# Patient Record
Sex: Male | Born: 1960 | Race: White | Hispanic: No | Marital: Married | Smoking: Never smoker
Health system: Southern US, Community
[De-identification: ages and names within clinical notes are randomized; demographics above are authoritative.]

## PROBLEM LIST (undated history)

## (undated) HISTORY — PX: TOTAL HIP ARTHROPLASTY: SHX124

## (undated) HISTORY — PX: VASECTOMY: SHX75

## (undated) HISTORY — PX: KNEE SURGERY: SHX244

---

## 2005-03-11 ENCOUNTER — Ambulatory Visit: Payer: Self-pay | Admitting: Unknown Physician Specialty

## 2005-03-11 LAB — HM COLONOSCOPY

## 2006-06-29 ENCOUNTER — Ambulatory Visit: Payer: Self-pay | Admitting: Unknown Physician Specialty

## 2006-08-24 ENCOUNTER — Ambulatory Visit: Payer: Self-pay | Admitting: Internal Medicine

## 2006-09-02 ENCOUNTER — Ambulatory Visit: Payer: Self-pay | Admitting: Unknown Physician Specialty

## 2006-09-09 ENCOUNTER — Ambulatory Visit: Payer: Self-pay | Admitting: Unknown Physician Specialty

## 2007-02-06 ENCOUNTER — Other Ambulatory Visit: Payer: Self-pay

## 2007-02-06 ENCOUNTER — Observation Stay: Payer: Self-pay | Admitting: Internal Medicine

## 2007-11-30 IMAGING — CR DG CHEST 2V
1 series · 2 of 2 positions shown · non-contrast
Comparison: none

REASON FOR EXAM: arm pain chest pain
COMMENTS:

PROCEDURE:     DXR - DXR CHEST PA (OR AP) AND LATERAL  - February 06, 2007  [DATE]
RESULT:     Comparison: No available comparison exam.

[Series 1: view not recorded · 0.17mm/px · 2 of 2 slices shown]
[im 1/2]
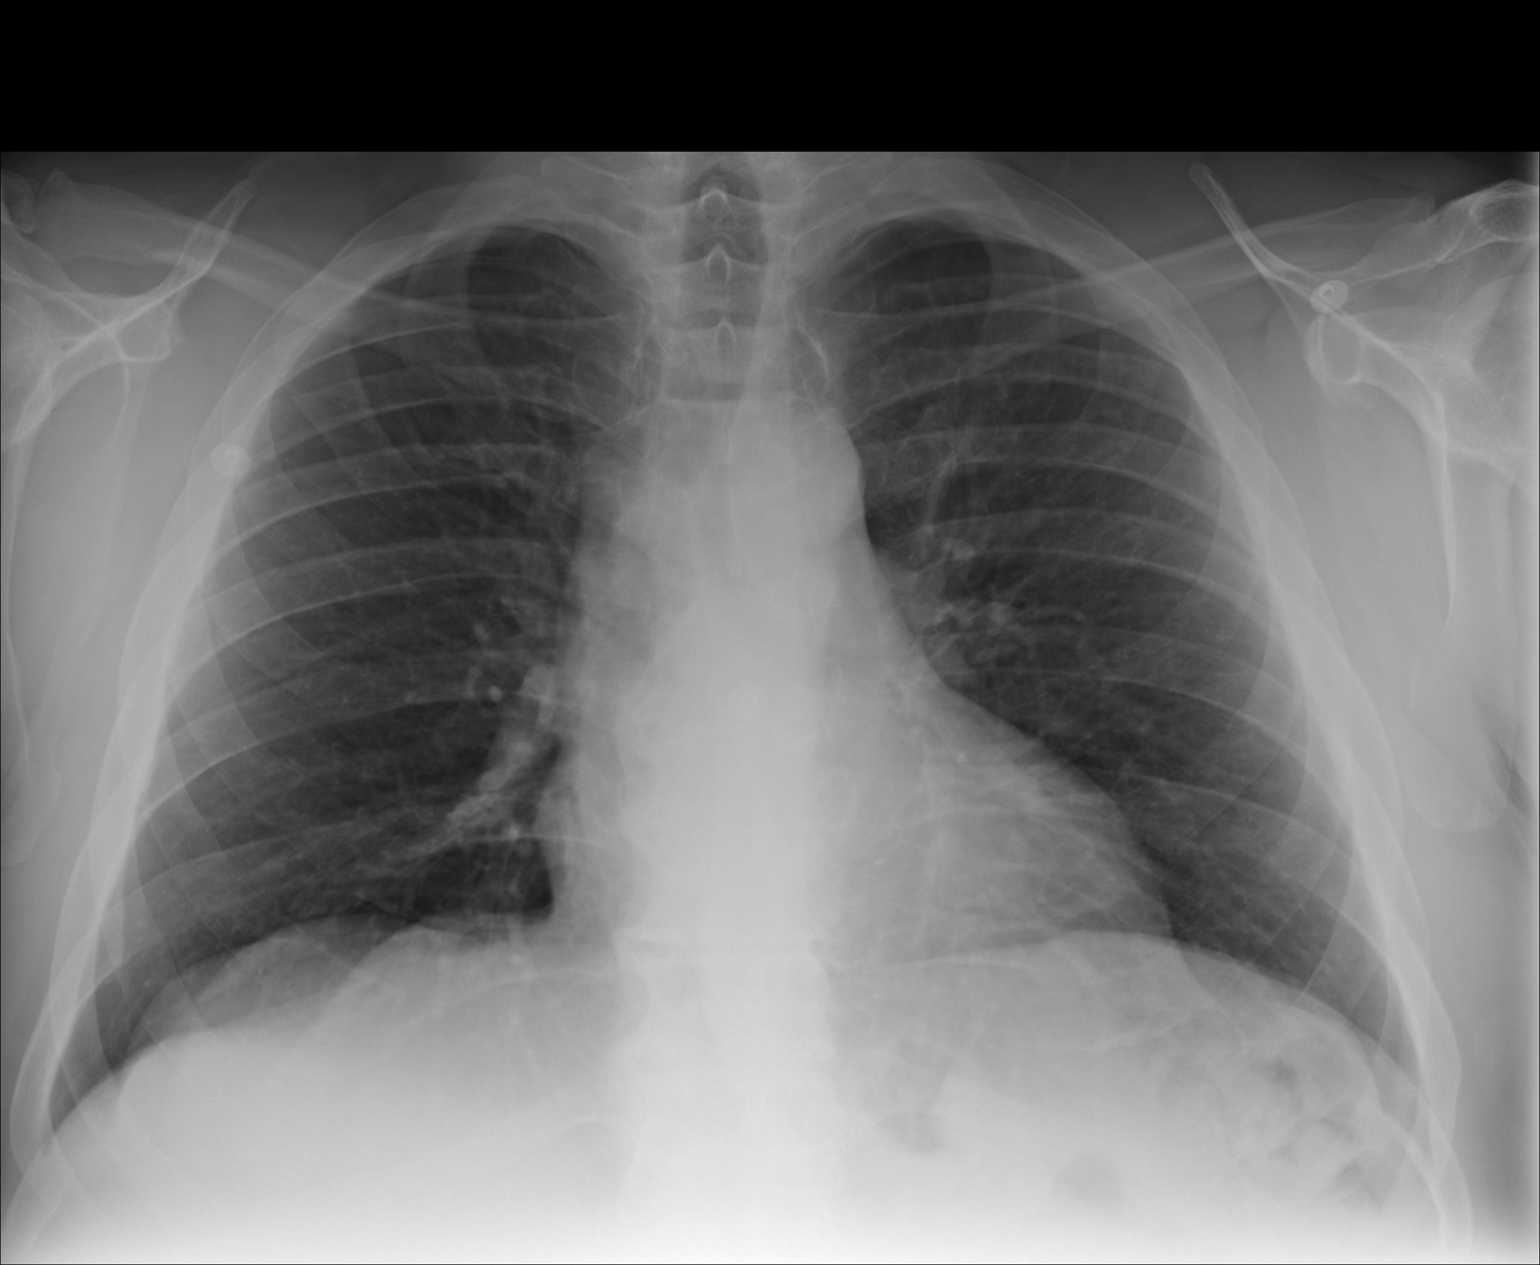
[im 2/2]
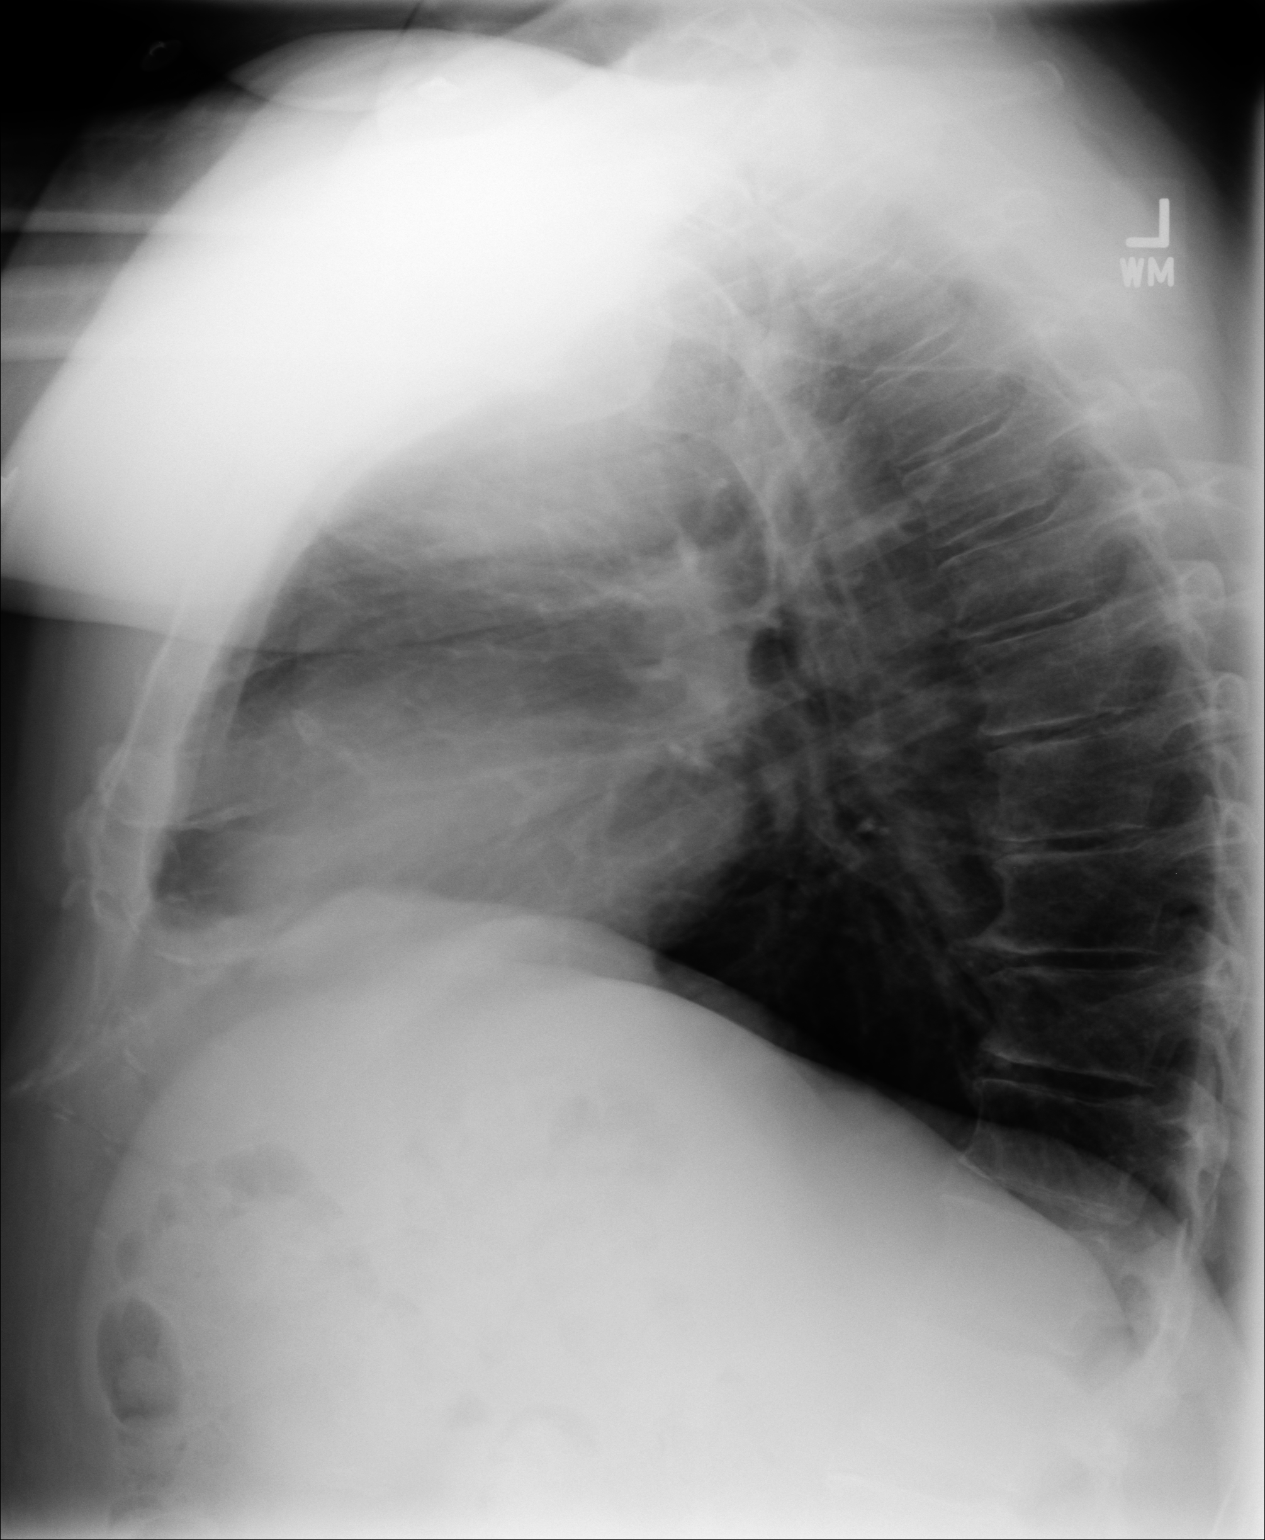

[2 of 2 positions shown; findings below may reference images not displayed]

FINDINGS: Low lung volumes. There is no significant pulmonary consolidation, pulmonary
edema, pleural effusion, nor pneumothorax. The cardiomediastinal silhouette
is within normal limits.

No displaced rib fracture is noted.
IMPRESSION: 1. No acute cardiopulmonary abnormality is noted.

## 2007-12-01 IMAGING — NM NM CHEST EXAM
1 series · 1 of 1 positions shown · non-contrast
Comparison: none

REASON FOR EXAM: CHEST PAIN
COMMENTS:

PROCEDURE:     NM  - NM GATED MYOVIEW 2 - DAY [DATE]  [DATE]
RESULT:
PRIMARY CARE PHYSICIAN:  Dr. Ngo.
HISTORY: This is a 46-year-old with abnormal EKG having arm and back pain
consistent with anginal equivalent.  The patient underwent a Bruce protocol
with an exercise time of 10 minutes achieving 11.7 METS, 80% MPHR with a
peak heart rate of 141 beats per minutes.  Peak systolic blood pressure is
169/93.  Baseline heart rate is 79 beats per minute. Baseline blood pressure
was 169/93.  The patient stopped due to end of protocol. There was no chest
pain or shortness of breath. Baseline EKG shows normal sinus rhythm, cannot
rule out inferior infarct and poor R-wave progression. Peak stress shows no
electrocardiographic changes or arrhythmia. The patient received 30.16 mCi
of Myoview peak stress.
IMAGE ANALYSIS: Normal LV systolic function with ejection fraction of 59%.
All myocardial walls have normal myocardial function. Rest and peak stress
myocardial perfusion images were compared showing normal myocardial
perfusion at rest and peak stress without evidence of myocardial ischemia.

[Series 1000: save_screens · 1 of 1 slices shown]
[im 1/1]
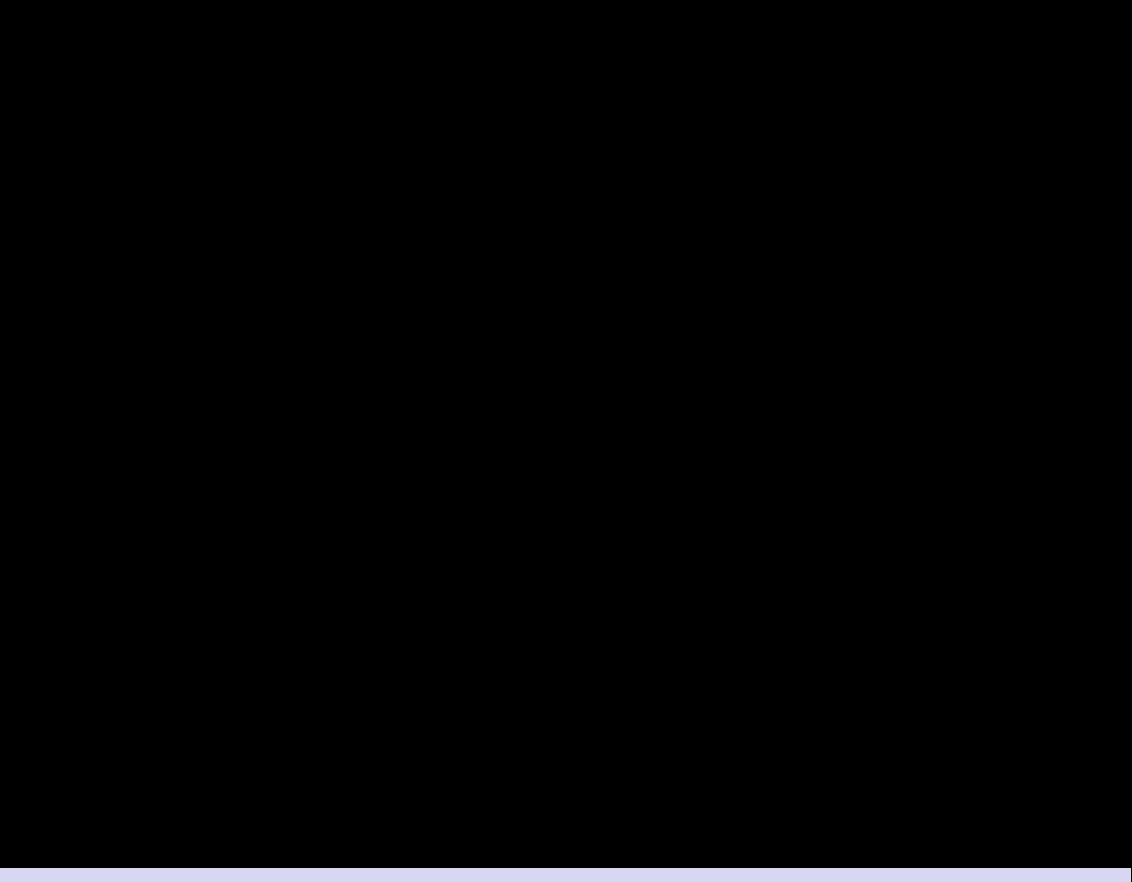

[1 of 1 positions shown; findings below may reference images not displayed]

IMPRESSION: 1)Normal treadmill EKG without evidence of ischemia.

2)Average exercise tolerance for age.

3)Normal LV systolic function with ejection fraction of 59%.

4)Normal stress images without evidence of myocardial ischemia.

## 2011-06-19 DIAGNOSIS — N529 Male erectile dysfunction, unspecified: Secondary | ICD-10-CM | POA: Insufficient documentation

## 2012-09-07 ENCOUNTER — Other Ambulatory Visit: Payer: Self-pay | Admitting: Medical Oncology

## 2013-11-13 LAB — PSA: PSA: 0.5

## 2013-12-17 DIAGNOSIS — F419 Anxiety disorder, unspecified: Secondary | ICD-10-CM

## 2013-12-17 DIAGNOSIS — F32A Depression, unspecified: Secondary | ICD-10-CM | POA: Insufficient documentation

## 2013-12-17 DIAGNOSIS — F329 Major depressive disorder, single episode, unspecified: Secondary | ICD-10-CM | POA: Insufficient documentation

## 2014-06-13 DIAGNOSIS — S7290XA Unspecified fracture of unspecified femur, initial encounter for closed fracture: Secondary | ICD-10-CM | POA: Insufficient documentation

## 2014-07-07 LAB — BASIC METABOLIC PANEL
Potassium: 4.4 mmol/L (ref 3.4–5.3)
Sodium: 140 mmol/L (ref 137–147)

## 2014-07-09 LAB — HEPATIC FUNCTION PANEL
ALT: 21 U/L (ref 10–40)
AST: 20 U/L (ref 14–40)
Alkaline Phosphatase: 77 U/L (ref 25–125)
BILIRUBIN, TOTAL: 0.2 mg/dL

## 2014-07-09 LAB — BASIC METABOLIC PANEL
BUN: 17 mg/dL (ref 4–21)
Creatinine: 1 mg/dL (ref <=1.3)
Glucose: 107 mg/dL

## 2014-07-09 LAB — LIPID PANEL
Cholesterol: 155 mg/dL (ref 0–200)
HDL: 36 mg/dL (ref 35–70)
LDL Cholesterol: 69 mg/dL
LDl/HDL Ratio: 1.9
TRIGLYCERIDES: 250 mg/dL — AB (ref 40–160)

## 2014-07-09 LAB — HEMOGLOBIN A1C: Hgb A1c MFr Bld: 5.6 % (ref 4.0–6.0)

## 2014-11-18 LAB — CBC AND DIFFERENTIAL
HEMATOCRIT: 36 % — AB (ref 41–53)
Hemoglobin: 11.4 g/dL — AB (ref 13.5–17.5)
Neutrophils Absolute: 59 /uL
PLATELETS: 251 10*3/uL (ref 150–399)
WBC: 5.5 10^3/mL

## 2014-12-21 DIAGNOSIS — F900 Attention-deficit hyperactivity disorder, predominantly inattentive type: Secondary | ICD-10-CM | POA: Insufficient documentation

## 2014-12-21 DIAGNOSIS — E669 Obesity, unspecified: Secondary | ICD-10-CM | POA: Insufficient documentation

## 2014-12-21 DIAGNOSIS — F329 Major depressive disorder, single episode, unspecified: Secondary | ICD-10-CM | POA: Insufficient documentation

## 2014-12-21 DIAGNOSIS — K219 Gastro-esophageal reflux disease without esophagitis: Secondary | ICD-10-CM | POA: Insufficient documentation

## 2014-12-21 DIAGNOSIS — M129 Arthropathy, unspecified: Secondary | ICD-10-CM | POA: Insufficient documentation

## 2014-12-21 DIAGNOSIS — E785 Hyperlipidemia, unspecified: Secondary | ICD-10-CM | POA: Insufficient documentation

## 2014-12-21 DIAGNOSIS — F3181 Bipolar II disorder: Secondary | ICD-10-CM | POA: Insufficient documentation

## 2014-12-21 DIAGNOSIS — E291 Testicular hypofunction: Secondary | ICD-10-CM | POA: Insufficient documentation

## 2014-12-21 DIAGNOSIS — B20 Human immunodeficiency virus [HIV] disease: Secondary | ICD-10-CM | POA: Insufficient documentation

## 2014-12-21 DIAGNOSIS — Z8739 Personal history of other diseases of the musculoskeletal system and connective tissue: Secondary | ICD-10-CM | POA: Insufficient documentation

## 2014-12-21 DIAGNOSIS — G5790 Unspecified mononeuropathy of unspecified lower limb: Secondary | ICD-10-CM | POA: Insufficient documentation

## 2014-12-21 DIAGNOSIS — R7303 Prediabetes: Secondary | ICD-10-CM | POA: Insufficient documentation

## 2014-12-21 DIAGNOSIS — D649 Anemia, unspecified: Secondary | ICD-10-CM | POA: Insufficient documentation

## 2014-12-21 DIAGNOSIS — Z21 Asymptomatic human immunodeficiency virus [HIV] infection status: Secondary | ICD-10-CM | POA: Insufficient documentation

## 2015-01-07 ENCOUNTER — Other Ambulatory Visit: Payer: Self-pay | Admitting: Family Medicine

## 2015-01-09 ENCOUNTER — Encounter: Payer: Self-pay | Admitting: Family Medicine

## 2015-01-09 ENCOUNTER — Ambulatory Visit (INDEPENDENT_AMBULATORY_CARE_PROVIDER_SITE_OTHER): Payer: 59 | Admitting: Family Medicine

## 2015-01-09 VITALS — BP 118/62 | HR 72 | Temp 98.5°F | Resp 14 | Wt 291.0 lb

## 2015-01-09 DIAGNOSIS — R809 Proteinuria, unspecified: Secondary | ICD-10-CM | POA: Diagnosis not present

## 2015-01-09 DIAGNOSIS — D649 Anemia, unspecified: Secondary | ICD-10-CM | POA: Diagnosis not present

## 2015-01-09 DIAGNOSIS — R7303 Prediabetes: Secondary | ICD-10-CM

## 2015-01-09 DIAGNOSIS — F900 Attention-deficit hyperactivity disorder, predominantly inattentive type: Secondary | ICD-10-CM | POA: Diagnosis not present

## 2015-01-09 DIAGNOSIS — E785 Hyperlipidemia, unspecified: Secondary | ICD-10-CM | POA: Diagnosis not present

## 2015-01-09 DIAGNOSIS — E669 Obesity, unspecified: Secondary | ICD-10-CM

## 2015-01-09 DIAGNOSIS — R7309 Other abnormal glucose: Secondary | ICD-10-CM | POA: Diagnosis not present

## 2015-01-09 LAB — POCT UA - MICROALBUMIN: MICROALBUMIN (UR) POC: 20 mg/L

## 2015-01-09 LAB — POCT GLYCOSYLATED HEMOGLOBIN (HGB A1C): Hemoglobin A1C: 5

## 2015-01-09 MED ORDER — LOSARTAN POTASSIUM 25 MG PO TABS: 25.0000 mg | ORAL_TABLET | Freq: Every day | ORAL | Status: DC

## 2015-01-09 NOTE — Progress Notes (Deleted)
Patient ID: Paul Walker, male   DOB: 08/26/1960, 54 y.o.   MRN: [id number]   Patient: Paul Walker Male    DOB: [birth year]   54 y.o.   MRN: [id number] Visit Date: 01/09/2015  Today's Provider: [name], MD   Chief Complaint  Patient presents with  . Proteinuria     re check today  . Hyperglycemia   Subjective:    Hyperglycemia This is a chronic problem. Pertinent negatives include no abdominal pain, anorexia, arthralgias, change in bowel habit, nausea, rash, swollen glands, vomiting or weakness.  Patient is taking Metformin. Last A1C in December 2015 was 5.6. He is not checking his sugar levels at home.  Proteinuria On last visit this was found. Losartan was added to protect his kidneys and is here to follow up on this issue.     Previous Medications   AMPHETAMINE-DEXTROAMPHETAMINE (ADDERALL XR) 30 MG 24 HR CAPSULE    Take by mouth.   ATORVASTATIN (LIPITOR) 20 MG TABLET    Take by mouth.   B COMPLEX VITAMINS PO    Take by mouth.   BUPROPION (WELLBUTRIN XL) 150 MG 24 HR TABLET    Take by mouth.   CHOLECALCIFEROL (D 10000) 10000 UNITS CAPS    Take by mouth.   CYANOCOBALAMIN 100 MCG TABLET    Take by mouth.   DOLUTEGRAVIR (TIVICAY) 50 MG TABLET    Take by mouth.   EMTRICITABINE-TENOFOVIR (TRUVADA) 200-300 MG PER TABLET    Take by mouth.   FERROUS SULFATE 325 (65 FE) MG TABLET    Take by mouth.   LANSOPRAZOLE (PREVACID) 30 MG CAPSULE    Take 1 capsule by mouth  daily   LOSARTAN (COZAAR) 25 MG TABLET    Take by mouth.   MELOXICAM (MOBIC) 15 MG TABLET    Take by mouth.   METFORMIN (GLUCOPHAGE) 1000 MG TABLET    Take by mouth.   MULTIPLE VITAMIN PO    Take by mouth.   OMEGA-3 FATTY ACIDS PO    Take by mouth.   TADALAFIL (CIALIS) 5 MG TABLET    Take by mouth.   TESTOSTERONE 20.25 MG/ACT (1.62%) GEL    Place onto the skin.    Review of Systems  Gastrointestinal: Negative for nausea, vomiting, abdominal pain, anorexia and change in bowel habit.  Musculoskeletal:  Negative for arthralgias.  Skin: Negative for rash.  Neurological: Negative for weakness.    History  Substance Use Topics  . Smoking status: Never Smoker   . Smokeless tobacco: Never Used  . Alcohol Use: Yes     Comment: ocassionally-beers/wine   Objective:   There were no vitals taken for this visit.  Physical Exam      Assessment & Plan:     There are no diagnoses linked to this encounter.   Follow up: No Follow-up on file.

## 2015-01-09 NOTE — Progress Notes (Signed)
Patient ID: Paul Walker, male   DOB: [birth year], 54 y.o.   MRN: [id number]   Paul Walker  MRN: [id number] DOB: 11/07/1960  Subjective:  Hyperglycemia This is a chronic problem. Pertinent negatives include no chest pain, chills, coughing, fever, myalgias, nausea, neck pain, rash, vomiting or weakness.  Patient is here for follow up. His las A1C was 5.6 in December 2015. He does not check his sugar levels and he is taking Metformin 1000 mg daily for control.  Proteinuria New. This was found on the last visit and Losartan was added to protect his kidneys.  Anemia Last visit was 2 months ago. Labs were done CBC, MMA, B12, Iron.-iron was low.  Patient Active Problem List   Diagnosis Date Noted  . Anemia 12/21/2014  . H/O arthritis 12/21/2014  . Arthropathia 12/21/2014  . ADD (attention deficit hyperactivity disorder, inattentive type) 12/21/2014  . Bipolar II disorder 12/21/2014  . Acid reflux 12/21/2014  . HIV (human immunodeficiency virus infection) 12/21/2014  . HLD (hyperlipidemia) 12/21/2014  . 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 Affective disorder, major 12/21/2014  . Mononeuritis of lower limb 12/21/2014  . Adiposity 12/21/2014  . Borderline diabetes 12/21/2014  . Anxiety and depression 12/17/2013  . 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  History   Social History  . Marital Status: Married    Spouse Name: Paul Walker  . Number of Children: 4  . Years of Education: 16   Occupational History  . [employer]    Social History Main Topics  . Smoking status: Never Smoker   . Smokeless tobacco: Never Used  . Alcohol Use: Yes     Comment: ocassionally-beers/wine  . Drug Use: No  . Sexual Activity: Yes    Birth Control/ Protection: Condom   Other Topics Concern  . Not on file   Social History Narrative    Outpatient Prescriptions Prior to Visit  Medication Sig Dispense Refill  . amphetamine-dextroamphetamine  (ADDERALL XR) 30 MG 24 hr capsule Take by mouth.    Marland Kitchen atorvastatin (LIPITOR) 20 MG tablet Take by mouth.    . B COMPLEX VITAMINS PO Take by mouth.    Marland Kitchen buPROPion (WELLBUTRIN XL) 150 MG 24 hr tablet Take by mouth.    . cyanocobalamin 100 MCG tablet Take by mouth.    . dolutegravir (TIVICAY) 50 MG tablet Take by mouth.    Marland Kitchen emtricitabine-tenofovir (TRUVADA) 200-300 MG per tablet Take by mouth.    . ferrous sulfate 325 (65 FE) MG tablet Take by mouth.    . lansoprazole (PREVACID) 30 MG capsule Take 1 capsule by mouth  daily 90 capsule 1  . losartan (COZAAR) 25 MG tablet Take by mouth.    . meloxicam (MOBIC) 15 MG tablet Take by mouth.    . metFORMIN (GLUCOPHAGE) 1000 MG tablet Take by mouth.    . MULTIPLE VITAMIN PO Take by mouth.    . OMEGA-3 FATTY ACIDS PO Take by mouth.    . tadalafil (CIALIS) 5 MG tablet Take by mouth.    . Testosterone 20.25 MG/ACT (1.62%) GEL Place onto the skin.    . hydrocortisone (ANUSOL-HC) 25 MG suppository Place rectally.    . vitamin E 100 UNIT capsule Take by mouth.     No facility-administered medications prior to visit.    Allergies  Allergen Reactions  . Penicillins     Stopped breathing    Review of Systems  Constitutional: Negative for fever,  chills, weight loss and malaise/fatigue.  HENT: Negative.   Respiratory: Negative for cough, hemoptysis, sputum production and shortness of breath.   Cardiovascular: Negative for chest pain, palpitations, orthopnea and claudication.  Gastrointestinal: Negative for heartburn, nausea and vomiting.  Genitourinary: Negative.   Musculoskeletal: Positive for joint pain. Negative for myalgias and neck pain.  Skin: Negative for itching and rash.  Neurological: Negative.  Negative for weakness.  Endo/Heme/Allergies: Negative.  Does not bruise/bleed easily.   Objective:  BP 118/62 mmHg  Pulse 72  Temp(Src) 98.5 F (36.9 C)  Resp 14  Wt 291 lb (131.997 kg)  Physical Exam  Constitutional: He is  well-developed, well-nourished, and in no distress.  HENT:  Head: Normocephalic and atraumatic.  Right Ear: External ear normal.  Left Ear: External ear normal.  Nose: Nose normal.  Mouth/Throat: Oropharynx is clear and moist.  Eyes: Conjunctivae and EOM are normal. Pupils are equal, round, and reactive to light.  Neck: Normal range of motion. Neck supple.  Cardiovascular: Normal rate, regular rhythm, normal heart sounds and intact distal pulses.   Pulmonary/Chest: Effort normal and breath sounds normal. He has no wheezes.  Abdominal: Soft.  Musculoskeletal: He exhibits edema (trace).  Neurological: He is alert.  Skin: Skin is warm and dry.  Psychiatric: Memory, affect and judgment normal.    Assessment and Plan :  1. Borderline diabetes A1C today 5.0. Much better. Continue to follow. Discussed with patient may decrease the use of Metformin at this time. Urine microalbumin-20.   - POCT HgB A1C - Microalbumin, urine  2. Anemia, unspecified anemia type Stable last time-will re check labs today. CBC, Iron and TIBC.  3. ADD (attention deficit hyperactivity disorder, inattentive type)  4. Adiposity Continue working on diet and exercise. Lost 13 lbs since last visit-intentionally.  5. Proteinuria Improved. Stay on Losartan-refill provided to mail order pharmacy.  6. Patient HIV positive Followed and treated by [facility].  6. Hyperlipidemia Check labs.-Lipid. MetC, CBC.    Patient was seen and examined by Dr. Bosie Walker and note was scribed by Paul Walker, RMA.    Paul Manson MD [facility] 01/09/2015 1:54 PM

## 2015-01-10 LAB — CBC WITH DIFFERENTIAL/PLATELET
Basophils Absolute: 0 10*3/uL (ref 0.0–0.2)
Basos: 0 %
EOS (ABSOLUTE): 0.1 10*3/uL (ref 0.0–0.4)
Eos: 3 %
Hematocrit: 44.5 % (ref 37.5–51.0)
Hemoglobin: 14.9 g/dL (ref 12.6–17.7)
Immature Grans (Abs): 0 10*3/uL (ref 0.0–0.1)
Immature Granulocytes: 0 %
LYMPHS: 39 %
Lymphocytes Absolute: 2.1 10*3/uL (ref 0.7–3.1)
MCH: 26.5 pg — AB (ref 26.6–33.0)
MCHC: 33.5 g/dL (ref 31.5–35.7)
MCV: 79 fL (ref 79–97)
Monocytes Absolute: 0.4 10*3/uL (ref 0.1–0.9)
Monocytes: 7 %
NEUTROS ABS: 2.7 10*3/uL (ref 1.4–7.0)
Neutrophils: 51 %
Platelets: 299 10*3/uL (ref 150–379)
RBC: 5.62 x10E6/uL (ref 4.14–5.80)
WBC: 5.4 10*3/uL (ref 3.4–10.8)

## 2015-01-10 LAB — COMPREHENSIVE METABOLIC PANEL
A/G RATIO: 2.3 (ref 1.1–2.5)
ALK PHOS: 49 IU/L (ref 39–117)
ALT: 24 IU/L (ref 0–44)
AST: 29 IU/L (ref 0–40)
Albumin: 5 g/dL (ref 3.5–5.5)
BILIRUBIN TOTAL: 0.4 mg/dL (ref 0.0–1.2)
BUN / CREAT RATIO: 16 (ref 9–20)
BUN: 16 mg/dL (ref 6–24)
CO2: 27 mmol/L (ref 18–29)
Calcium: 9.8 mg/dL (ref 8.7–10.2)
Chloride: 99 mmol/L (ref 97–108)
Creatinine, Ser: 0.99 mg/dL (ref 0.76–1.27)
GFR calc Af Amer: 101 mL/min/{1.73_m2} (ref >59)
GFR calc non Af Amer: 87 mL/min/{1.73_m2} (ref >59)
Globulin, Total: 2.2 g/dL (ref 1.5–4.5)
Glucose: 91 mg/dL (ref 65–99)
Potassium: 4.3 mmol/L (ref 3.5–5.2)
SODIUM: 139 mmol/L (ref 134–144)
Total Protein: 7.2 g/dL (ref 6.0–8.5)

## 2015-01-10 LAB — LIPID PANEL WITH LDL/HDL RATIO
Cholesterol, Total: 143 mg/dL (ref 100–199)
HDL: 31 mg/dL — ABNORMAL LOW (ref >39)
LDL CALC: 73 mg/dL (ref 0–99)
LDL/HDL RATIO: 2.4 ratio (ref 0.0–3.6)
TRIGLYCERIDES: 196 mg/dL — AB (ref 0–149)
VLDL Cholesterol Cal: 39 mg/dL (ref 5–40)

## 2015-01-10 LAB — IRON AND TIBC
IRON: 104 ug/dL (ref 38–169)
Iron Saturation: 34 % (ref 15–55)
TIBC: 304 ug/dL (ref 250–450)
UIBC: 200 ug/dL (ref 111–343)

## 2015-01-13 ENCOUNTER — Telehealth: Payer: Self-pay

## 2015-01-13 LAB — HM COLONOSCOPY

## 2015-01-13 NOTE — Telephone Encounter (Signed)
-----   Message from Maple Hudson., MD sent at 01/12/2015  3:47 PM EDT ----- Labs stable. Please advise patient. Trigs better with weight loss--this is good.

## 2015-01-13 NOTE — Telephone Encounter (Signed)
LMTCB ED 

## 2015-01-15 NOTE — Telephone Encounter (Signed)
Pt returning call.  CB#[phone]/[id number]

## 2015-01-17 NOTE — Telephone Encounter (Signed)
Patient advised  ED 

## 2015-01-26 ENCOUNTER — Other Ambulatory Visit: Payer: Self-pay | Admitting: Family Medicine

## 2015-03-04 ENCOUNTER — Encounter: Payer: Self-pay | Admitting: Family Medicine

## 2015-03-12 ENCOUNTER — Other Ambulatory Visit: Payer: Self-pay | Admitting: Family Medicine

## 2015-03-13 NOTE — Telephone Encounter (Signed)
See request for refill on Cialis 5 mg.

## 2015-05-07 ENCOUNTER — Other Ambulatory Visit: Payer: Self-pay | Admitting: Family Medicine

## 2015-05-19 ENCOUNTER — Ambulatory Visit (INDEPENDENT_AMBULATORY_CARE_PROVIDER_SITE_OTHER): Payer: 59 | Admitting: Family Medicine

## 2015-05-19 ENCOUNTER — Encounter: Payer: Self-pay | Admitting: Family Medicine

## 2015-05-19 VITALS — BP 124/66 | HR 96 | Temp 98.6°F | Resp 16 | Wt 304.8 lb

## 2015-05-19 DIAGNOSIS — E119 Type 2 diabetes mellitus without complications: Secondary | ICD-10-CM | POA: Diagnosis not present

## 2015-05-19 LAB — POCT GLYCOSYLATED HEMOGLOBIN (HGB A1C): HEMOGLOBIN A1C: 5.1

## 2015-05-19 NOTE — Patient Instructions (Signed)
You will get a call from us regarding lifestyle referral.

## 2015-05-19 NOTE — Progress Notes (Signed)
Subjective:     Patient ID: Paul Walker, male   DOB: 1961-03-19, 54 y.o.   MRN: [id number]  HPI  Chief Complaint  Patient presents with  . Diabetes    follow-up, OV with Dr Sullivan LoneGilbert on 01/09/2015 for borderline diabetes.  States he wishes his sugar checked as his weight has come up and he has noticed some tingling in his extremities. Reports compliance with metformin though does not check sugar at home. States he exercises intensively 7 days a week but has trouble with snacking especially at night. Wishes possible nutritionist referral.   Review of Systems  Respiratory: Negative for shortness of breath.   Cardiovascular: Negative for chest pain and palpitations.       Objective:   Physical Exam  Constitutional: He appears well-developed and well-nourished. No distress.  Cardiovascular: Normal rate and regular rhythm.   Pulmonary/Chest: Breath sounds normal.  Musculoskeletal: He exhibits no edema (of lower extremities).       Assessment:    1. Type 2 diabetes mellitus without complication, without long-term current use of insulin (HCC) - POCT glycosylated hemoglobin (Hb A1C) - Ambulatory referral to diabetic education    Plan:    F/u with your primary M.D., Dr. Sullivan LoneGilbert, in 3 months. You will get a call regarding the referral.

## 2015-06-09 ENCOUNTER — Encounter: Payer: Self-pay | Admitting: Dietician

## 2015-06-09 ENCOUNTER — Encounter: Payer: 59 | Attending: Family Medicine | Admitting: Dietician

## 2015-06-09 VITALS — Ht 74.0 in | Wt 304.5 lb

## 2015-06-09 DIAGNOSIS — R7303 Prediabetes: Secondary | ICD-10-CM | POA: Diagnosis present

## 2015-06-09 DIAGNOSIS — E669 Obesity, unspecified: Secondary | ICD-10-CM | POA: Diagnosis present

## 2015-06-09 DIAGNOSIS — R739 Hyperglycemia, unspecified: Secondary | ICD-10-CM

## 2015-06-09 NOTE — Progress Notes (Signed)
Medical Nutrition Therapy: Visit start time: 1100  end time: 1200  Assessment:  Diagnosis: pre-diabetes, obesity Past medical history: hyperlipidemia, GERD, arthritis Psychosocial issues/ stress concerns: history of ADHD, depression -- taking medications to treat Preferred learning method:  . Visual  Current weight: 304.5lbs  Height: 6'2" Medications, supplements: reviewed list in medical record with patient Progress and evaluation: Patient reports some history of dieting for weight loss; has done weight watchers and Atkins diet, with short-term success.          He has been restricting gluten intake to reduce inflammation from arthritis, which he feels is working.          He states his most recent HbA1C level was 5.1% after taking Metformin.          He is seeking help in following healthy eating pattern for weight loss, in hopes of controlling BGs and reducing arthritis pain.   Physical activity: cardio and strength training 1-1.5 hours, 7 days per week  Dietary Intake:  Usual eating pattern includes 3 meals and 2 evening snacks per day. Dining out frequency: 4 meals per week.  Breakfast: protein shake after working out. 340kcal 50g protein Snack: usually none Lunch: lean cuisine meal, lowfat yogurt, popcorn chips Snack: none Supper: often 7pm or later; meat, occasionally salmon, and vegetable; low gluten starches such as rice, GF pasta.  Snack: 2 evening snacks often sweet or salty snack foods Beverages: Diet sodas, some water  Nutrition Care Education: Topics covered: weight control, BG control, anti-inflammatory diet Basic nutrition: basic food groups, appropriate nutrient balance, appropriate meal and snack schedule, general nutrition guidelines    Weight control: benefits of weight control, determining reasonable weight goal, behavioral changes for weight loss,       2000kcal meal plan for weight loss including 45% CHO, 25%protein, 30% fat, portion control  Pre-Diabetes:   appropriate carb intake and balance, healthy carb choices Anti-inflammatory:  Importance of increasing vegetables and fruits and whole grains, and options for doing so  Nutritional Diagnosis:  [place]-3.3 Overweight/obesity As related to yo-yo dieting, excess caloric intake.  As evidenced by patient report, high BMI.  Intervention: Instruction as noted above.    Discussed possibility of resuming food intake tracking program, making it maintainable for patient.    Patient declined scheduling follow-up visit at this time, but will schedule later if needed.   Education Materials given:  . Food lists/ Planning A Balanced Meal . Sample meal pattern/ menus . Snacking handout . Goals/ instructions   Learner/ who was taught:  . Patient   Level of understanding: Marland Kitchen. Verbalizes/ demonstrates competency . Not applicable Demonstrated degree of understanding via:   Teach back Learning barriers: . None  Willingness to learn/ readiness for change: . Eager, change in progress   Monitoring and Evaluation:  Dietary intake, exercise, BG control, arthritis pain level, and body weight      follow up: prn

## 2015-06-09 NOTE — Patient Instructions (Signed)
   Pre-prep some healthy snacks such as fruits, raw veggies, or homemade snack mix.   Measure some food portions, especially starches, and aim for 1 cup or less per meal.   Resume some food intake tracking at least some days, or track goals.   Take in enough calories during the day to control appetite at night; for 2000kcal per day, average would be 500-550 calories per meal, which allows for 2 snacks amounting to 400-500 calories total.   Include plenty of vegetables and fruits during the day for fulness and for anti-inflammatory properties.

## 2015-08-07 ENCOUNTER — Other Ambulatory Visit: Payer: Self-pay | Admitting: Family Medicine

## 2015-08-19 ENCOUNTER — Ambulatory Visit (INDEPENDENT_AMBULATORY_CARE_PROVIDER_SITE_OTHER): Payer: 59 | Admitting: Family Medicine

## 2015-08-19 ENCOUNTER — Encounter: Payer: Self-pay | Admitting: Family Medicine

## 2015-08-19 VITALS — BP 104/58 | HR 76 | Temp 97.9°F | Resp 14 | Wt 306.0 lb

## 2015-08-19 DIAGNOSIS — E785 Hyperlipidemia, unspecified: Secondary | ICD-10-CM | POA: Diagnosis not present

## 2015-08-19 DIAGNOSIS — F418 Other specified anxiety disorders: Secondary | ICD-10-CM

## 2015-08-19 DIAGNOSIS — I10 Essential (primary) hypertension: Secondary | ICD-10-CM | POA: Diagnosis not present

## 2015-08-19 DIAGNOSIS — K219 Gastro-esophageal reflux disease without esophagitis: Secondary | ICD-10-CM | POA: Diagnosis not present

## 2015-08-19 DIAGNOSIS — E669 Obesity, unspecified: Secondary | ICD-10-CM | POA: Diagnosis not present

## 2015-08-19 DIAGNOSIS — F900 Attention-deficit hyperactivity disorder, predominantly inattentive type: Secondary | ICD-10-CM

## 2015-08-19 DIAGNOSIS — Z21 Asymptomatic human immunodeficiency virus [HIV] infection status: Secondary | ICD-10-CM | POA: Diagnosis not present

## 2015-08-19 DIAGNOSIS — F419 Anxiety disorder, unspecified: Secondary | ICD-10-CM

## 2015-08-19 DIAGNOSIS — B20 Human immunodeficiency virus [HIV] disease: Secondary | ICD-10-CM

## 2015-08-19 DIAGNOSIS — F329 Major depressive disorder, single episode, unspecified: Secondary | ICD-10-CM

## 2015-08-19 NOTE — Progress Notes (Signed)
Patient ID: Paul Walker, male   DOB: January 08, 1961, 55 y.o.   MRN: [id number]    Subjective:  HPI Patient is here for follow up.  Hypertension:  Patient checks B/P occasionally this morning at the doctors office it was 126/79. No cardiac symptoms. BP Readings from Last 3 Encounters:  08/19/15 104/58  05/19/15 124/66  01/09/15 118/62   GERD Patient takes Prevacid and symptoms are control so far. No breakthrough heartburn.  Hyperlipidemia: Patient is taking Lipitor and his last level was checked in June 2016.  Prior to Admission medications   Medication Sig Start Date End Date Taking? Authorizing Provider  amphetamine-dextroamphetamine (ADDERALL XR) 30 MG 24 hr capsule Take by mouth. 03/24/11  Yes Historical Provider, MD  atorvastatin (LIPITOR) 20 MG tablet Take 1 tablet by mouth at  bedtime 05/08/15  Yes Rosalina Dingwall Hulen Shouts., MD  B COMPLEX VITAMINS PO Take by mouth.   Yes Historical Provider, MD  buPROPion (WELLBUTRIN XL) 150 MG 24 hr tablet Take by mouth. 09/10/10  Yes Historical Provider, MD  Cholecalciferol (D 10000) 10000 UNITS CAPS Take by mouth.   Yes Historical Provider, MD  CIALIS 5 MG tablet Take 1 tablet by mouth  daily 03/13/15  Yes Aayden Cefalu Hulen Shouts., MD  cyanocobalamin 100 MCG tablet Take by mouth.   Yes Historical Provider, MD  dolutegravir (TIVICAY) 50 MG tablet Take by mouth.   Yes Historical Provider, MD  emtricitabine-tenofovir (TRUVADA) 200-300 MG per tablet Take by mouth.   Yes Historical Provider, MD  ferrous sulfate 325 (65 FE) MG tablet Take by mouth.   Yes Historical Provider, MD  lansoprazole (PREVACID) 30 MG capsule Take 1 capsule by mouth  daily 05/08/15  Yes Maple Hudson., MD  losartan (COZAAR) 25 MG tablet Take 1 tablet by mouth  daily 08/07/15  Yes Juvia Aerts Hulen Shouts., MD  meloxicam (MOBIC) 15 MG tablet Take by mouth.   Yes Historical Provider, MD  metFORMIN (GLUCOPHAGE) 1000 MG tablet Take by mouth 2 (two) times daily with a meal.  11/26/14  Yes  Historical Provider, MD  MULTIPLE VITAMIN PO Take by mouth.   Yes Historical Provider, MD  OMEGA-3 FATTY ACIDS PO Take by mouth.   Yes Historical Provider, MD  Testosterone 20.25 MG/ACT (1.62%) GEL Place onto the skin. 03/08/14  Yes Historical Provider, MD    Patient Active Problem List   Diagnosis Date Noted  . Type 2 diabetes mellitus without complication, without long-term current use of insulin (HCC) 05/19/2015  . Anemia 12/21/2014  . H/O arthritis 12/21/2014  . Arthropathia 12/21/2014  . ADD (attention deficit hyperactivity disorder, inattentive type) 12/21/2014  . Bipolar II disorder (HCC) 12/21/2014  . Acid reflux 12/21/2014  . HIV (human immunodeficiency virus infection) (HCC) 12/21/2014  . HLD (hyperlipidemia) 12/21/2014  . 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 Affective disorder, major (HCC) 12/21/2014  . Mononeuritis of lower limb 12/21/2014  . Adiposity 12/21/2014  . Anxiety and depression 12/17/2013  . 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  Social History   Social History  . Marital Status: Married    Spouse Name: [name]  . Number of Children: 4  . Years of Education: 16   Occupational History  . [employer]    Social History Main Topics  . Smoking status: Never Smoker   . Smokeless tobacco: Never Used  . Alcohol Use: 1.8 - 2.4 oz/week    3-4 Standard drinks or equivalent per  week     Comment: ocassionally-beers/wine  . Drug Use: No  . Sexual Activity: Yes    Birth Control/ Protection: Condom   Other Topics Concern  . Not on file   Social History Narrative    Allergies  Allergen Reactions  . Penicillins     Stopped breathing    Review of Systems  Constitutional: Positive for malaise/fatigue. Negative for fever and chills.  Respiratory: Negative.   Cardiovascular: Positive for leg swelling.  Gastrointestinal: Negative.   Musculoskeletal: Positive for joint pain. Negative for myalgias and neck  pain.  Neurological: Negative.  Negative for weakness.  Psychiatric/Behavioral: Negative.     Immunization History  Administered Date(s) Administered  . Influenza-Unspecified 06/07/2015  . Tdap 07/04/2014   Objective:  BP 104/58 mmHg  Pulse 76  Temp(Src) 97.9 F (36.6 C)  Resp 14  Wt 306 lb (138.801 kg)  Physical Exam  Constitutional: He is oriented to person, place, and time and well-developed, well-nourished, and in no distress.  HENT:  Head: Normocephalic and atraumatic.  Eyes: Conjunctivae are normal. Pupils are equal, round, and reactive to light.  Neck: Normal range of motion. Neck supple.  Cardiovascular: Normal rate, regular rhythm, normal heart sounds and intact distal pulses.   No murmur heard. Pulmonary/Chest: Effort normal and breath sounds normal. No respiratory distress. He has no wheezes.  Abdominal: Soft.  Musculoskeletal: Normal range of motion. He exhibits edema (trace). He exhibits no tenderness.  Neurological: He is alert and oriented to person, place, and time. Gait normal.  Skin: Skin is warm and dry.  Psychiatric: Mood, memory, affect and judgment normal.    Lab Results  Component Value Date   WBC 5.4 01/09/2015   HGB 11.4* 11/18/2014   HCT 44.5 01/09/2015   PLT 299 01/09/2015   GLUCOSE 91 01/09/2015   CHOL 143 01/09/2015   TRIG 196* 01/09/2015   HDL 31* 01/09/2015   LDLCALC 73 01/09/2015   PSA 0.5 11/13/2013   HGBA1C 5.1 05/19/2015    CMP     Component Value Date/Time   NA 139 01/09/2015 1438   K 4.3 01/09/2015 1438   CL 99 01/09/2015 1438   CO2 27 01/09/2015 1438   GLUCOSE 91 01/09/2015 1438   BUN 16 01/09/2015 1438   CREATININE 0.99 01/09/2015 1438   CREATININE 1.0 07/09/2014   CALCIUM 9.8 01/09/2015 1438   PROT 7.2 01/09/2015 1438   ALBUMIN 5.0 01/09/2015 1438   AST 29 01/09/2015 1438   ALT 24 01/09/2015 1438   ALKPHOS 49 01/09/2015 1438   BILITOT 0.4 01/09/2015 1438   GFRNONAA 87 01/09/2015 1438   GFRAA 101 01/09/2015  1438    Assessment and Plan :  1. Hyperlipidemia Stable on last check. Sugar has been in normal range the last 2 times. Will check today to make sure that is stable. 2. Adiposity Work on habits.  3. ADD (attention deficit hyperactivity disorder, inattentive type) Follows psychiatrist.  4. Anxiety and depression Follows psychiatrist.  5. Gastroesophageal reflux disease, esophagitis presence not specified Stable. Discussed the risk of long use of PPI. Discussed with patient trying to cut back on Prevacid use or even trying to stop it all together. Will follow.  6. Essential hypertension Stable.  7. HIV (human immunodeficiency virus infection) (HCC) Treated at [facility]. I have done the exam and reviewed the above chart and it is accurate to the best of my knowledge.  Patient was seen and examined by Dr. Bosie Clos and note was scribed by  Samara Deist, RMA.    Julieanne Manson MD [facility] 08/19/2015 1:46 PM

## 2015-09-09 LAB — COMPREHENSIVE METABOLIC PANEL
ALT: 34 IU/L (ref 0–44)
AST: 34 IU/L (ref 0–40)
Albumin/Globulin Ratio: 2 (ref 1.1–2.5)
Albumin: 4.8 g/dL (ref 3.5–5.5)
Alkaline Phosphatase: 48 IU/L (ref 39–117)
BUN/Creatinine Ratio: 19 (ref 9–20)
BUN: 23 mg/dL (ref 6–24)
Bilirubin Total: 0.3 mg/dL (ref 0.0–1.2)
CALCIUM: 9.3 mg/dL (ref 8.7–10.2)
CO2: 21 mmol/L (ref 18–29)
CREATININE: 1.24 mg/dL (ref 0.76–1.27)
Chloride: 99 mmol/L (ref 96–106)
GFR calc Af Amer: 76 mL/min/{1.73_m2} (ref >59)
GFR, EST NON AFRICAN AMERICAN: 66 mL/min/{1.73_m2} (ref >59)
GLOBULIN, TOTAL: 2.4 g/dL (ref 1.5–4.5)
Glucose: 90 mg/dL (ref 65–99)
Potassium: 4.6 mmol/L (ref 3.5–5.2)
Sodium: 141 mmol/L (ref 134–144)
Total Protein: 7.2 g/dL (ref 6.0–8.5)

## 2015-09-09 LAB — CBC WITH DIFFERENTIAL/PLATELET
Basophils Absolute: 0 10*3/uL (ref 0.0–0.2)
Basos: 0 %
EOS (ABSOLUTE): 0.1 10*3/uL (ref 0.0–0.4)
EOS: 1 %
HEMATOCRIT: 45.9 % (ref 37.5–51.0)
Hemoglobin: 16.4 g/dL (ref 12.6–17.7)
IMMATURE GRANULOCYTES: 0 %
Immature Grans (Abs): 0 10*3/uL (ref 0.0–0.1)
LYMPHS: 30 %
Lymphocytes Absolute: 1.4 10*3/uL (ref 0.7–3.1)
MCH: 32.5 pg (ref 26.6–33.0)
MCHC: 35.7 g/dL (ref 31.5–35.7)
MCV: 91 fL (ref 79–97)
MONOS ABS: 0.4 10*3/uL (ref 0.1–0.9)
Monocytes: 8 %
NEUTROS PCT: 61 %
Neutrophils Absolute: 2.8 10*3/uL (ref 1.4–7.0)
PLATELETS: 210 10*3/uL (ref 150–379)
RBC: 5.05 x10E6/uL (ref 4.14–5.80)
RDW: 13 % (ref 12.3–15.4)
WBC: 4.6 10*3/uL (ref 3.4–10.8)

## 2015-09-09 LAB — LIPID PANEL WITH LDL/HDL RATIO
Cholesterol, Total: 141 mg/dL (ref 100–199)
HDL: 41 mg/dL (ref >39)
LDL Calculated: 80 mg/dL (ref 0–99)
LDl/HDL Ratio: 2 ratio units (ref 0.0–3.6)
TRIGLYCERIDES: 101 mg/dL (ref 0–149)
VLDL Cholesterol Cal: 20 mg/dL (ref 5–40)

## 2015-09-09 LAB — TSH: TSH: 3.64 u[IU]/mL (ref 0.450–4.500)

## 2015-09-09 LAB — HEMOGLOBIN A1C
ESTIMATED AVERAGE GLUCOSE: 108 mg/dL
Hgb A1c MFr Bld: 5.4 % (ref 4.8–5.6)

## 2015-09-29 ENCOUNTER — Other Ambulatory Visit: Payer: Self-pay | Admitting: Family Medicine

## 2015-11-18 ENCOUNTER — Other Ambulatory Visit: Payer: Self-pay | Admitting: Family Medicine

## 2015-11-26 ENCOUNTER — Ambulatory Visit (INDEPENDENT_AMBULATORY_CARE_PROVIDER_SITE_OTHER): Payer: 59 | Admitting: Family Medicine

## 2015-11-26 ENCOUNTER — Encounter: Payer: Self-pay | Admitting: Family Medicine

## 2015-11-26 VITALS — BP 108/66 | HR 100 | Temp 97.9°F | Resp 16 | Ht 74.0 in | Wt 277.0 lb

## 2015-11-26 DIAGNOSIS — Z0000 Encounter for general adult medical examination without abnormal findings: Secondary | ICD-10-CM

## 2015-11-26 LAB — POCT URINALYSIS DIPSTICK
Bilirubin, UA: NEGATIVE
Blood, UA: NEGATIVE
GLUCOSE UA: NEGATIVE
Leukocytes, UA: NEGATIVE
Nitrite, UA: NEGATIVE
PROTEIN UA: NEGATIVE
SPEC GRAV UA: 1.02
Urobilinogen, UA: NEGATIVE
pH, UA: 6

## 2015-11-26 NOTE — Progress Notes (Signed)
Patient ID: Paul Walker, male   DOB: 04-Dec-1960, 55 y.o.   MRN: [id number]  Visit Date: 11/26/2015  Today's Provider: [name], MD   Chief Complaint  Patient presents with  . Annual Exam   Subjective:  Paul Walker is a 55 y.o. male who presents today for health maintenance and complete physical. He feels fairly well. He reports exercising daily. He reports he is sleeping well. Immunization History  Administered Date(s) Administered  . Influenza-Unspecified 06/07/2015  . Tdap 07/04/2014  He is divorced from the mother of his 4 children. He is now day and married to his partner. He has a HIV. Last Colonoscopy 01/13/15 stool in the entire colon otherwise normal exam, repeat in 3 years due to the poor prep quality.  Endoscopy 03/11/05 esophageal ulcer, schatzki ring  Review of Systems  Constitutional: Negative.   HENT: Positive for congestion.   Eyes: Negative.   Respiratory: Positive for cough.   Cardiovascular: Negative.   Gastrointestinal: Negative.   Endocrine: Negative.   Genitourinary: Negative.   Musculoskeletal: Negative.   Skin: Negative.   Allergic/Immunologic: Negative.   Neurological: Negative.   Hematological: Negative.   Psychiatric/Behavioral: Negative.     Social History   Social History  . Marital Status: Married    Spouse Name: [name]  . Number of Children: 4  . Years of Education: 16   Occupational History  . [employer]    Social History Main Topics  . Smoking status: Never Smoker   . Smokeless tobacco: Never Used  . Alcohol Use: 1.8 - 2.4 oz/week    3-4 Standard drinks or equivalent per week     Comment: ocassionally-beers/wine  . Drug Use: No  . Sexual Activity: Yes    Birth Control/ Protection: Condom   Other Topics Concern  . Not on file   Social History Narrative    Patient Active Problem List   Diagnosis Date Noted  . Type 2 diabetes mellitus without complication, without long-term current use of insulin (HCC)  05/19/2015  . Anemia 12/21/2014  . H/O arthritis 12/21/2014  . Arthropathia 12/21/2014  . ADD (attention deficit hyperactivity disorder, inattentive type) 12/21/2014  . Bipolar II disorder (HCC) 12/21/2014  . Acid reflux 12/21/2014  . HIV (human immunodeficiency virus infection) (HCC) 12/21/2014  . HLD (hyperlipidemia) 12/21/2014  . 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 Affective disorder, major (HCC) 12/21/2014  . Mononeuritis of lower limb 12/21/2014  . Adiposity 12/21/2014  . Fracture of femur (HCC) 06/13/2014  . Anxiety and depression 12/17/2013  . ED (erectile dysfunction) of organic origin 06/19/2011    Past Surgical History  Procedure Laterality Date  . Vasectomy    . Knee surgery Right     arthroscopy and replacement  . Total hip arthroplasty Bilateral     His family history includes Aplastic anemia in his mother; Colon cancer in his father; Heart attack in his paternal grandfather; Hypertension in his mother; Obesity in his brother; Stroke in his paternal grandmother.    Outpatient Prescriptions Prior to Visit  Medication Sig Dispense Refill  . amphetamine-dextroamphetamine (ADDERALL XR) 30 MG 24 hr capsule Take by mouth.    Marland Kitchen atorvastatin (LIPITOR) 20 MG tablet Take 1 tablet by mouth at  bedtime 90 tablet 3  . B COMPLEX VITAMINS PO Take by mouth.    Marland Kitchen buPROPion (WELLBUTRIN XL) 150 MG 24 hr tablet Take by mouth.    . Cholecalciferol (D 10000) 10000 UNITS CAPS Take by mouth.    Elvia Collum  5 MG tablet Take 1 tablet by mouth  daily 90 tablet 3  . cyanocobalamin 100 MCG tablet Take by mouth.    . dolutegravir (TIVICAY) 50 MG tablet Take by mouth.    Marland Kitchen. emtricitabine-tenofovir (TRUVADA) 200-300 MG per tablet Take by mouth.    . ferrous sulfate 325 (65 FE) MG tablet Take by mouth.    . lansoprazole (PREVACID) 30 MG capsule Take 1 capsule by mouth  daily 90 capsule 3  . losartan (COZAAR) 25 MG tablet Take 1 tablet by mouth  daily 90 tablet 1  . meloxicam (MOBIC) 15 MG tablet Take by  mouth.    . metformin (FORTAMET) 1000 MG (OSM) 24 hr tablet Take 1 tablet by mouth two  times daily 180 tablet 3  . metFORMIN (GLUCOPHAGE) 1000 MG tablet Take by mouth 2 (two) times daily with a meal.     . MULTIPLE VITAMIN PO Take by mouth.    . OMEGA-3 FATTY ACIDS PO Take by mouth.    . Testosterone 20.25 MG/ACT (1.62%) GEL Place onto the skin.     No facility-administered medications prior to visit.    Patient Care Team: Maple Hudsonichard L Silvie Obremski Jr., MD as PCP - General (Family Medicine)     Objective:   Vitals:  Filed Vitals:   11/26/15 1413  BP: 108/66  Pulse: 100  Temp: 97.9 F (36.6 C)  Resp: 16  Height: 6\' 2"  (1.88 m)  Weight: 277 lb (125.646 kg)    Physical Exam  Constitutional: He is oriented to person, place, and time. He appears well-developed and well-nourished.  HENT:  Head: Normocephalic and atraumatic.  Right Ear: External ear normal.  Left Ear: External ear normal.  Eyes: Conjunctivae are normal. Pupils are equal, round, and reactive to light.  Neck: Normal range of motion. Neck supple.  Cardiovascular: Normal rate, regular rhythm, normal heart sounds and intact distal pulses.   No murmur heard. Pulmonary/Chest: Effort normal and breath sounds normal. No respiratory distress. He has no wheezes.  Abdominal: Soft. There is no tenderness. There is no rebound.  Musculoskeletal: He exhibits no edema or tenderness.  Neurological: He is alert and oriented to person, place, and time.  Skin: No erythema.  Psychiatric: He has a normal mood and affect. His behavior is normal. Judgment and thought content normal.  monofilament exam of the feet is normal.   Depression Screen PHQ 2/9 Scores 06/09/2015  PHQ - 2 Score 0   Diabetic Foot Exam - Simple   Simple Foot Form  Diabetic Foot exam was performed with the following findings:  Yes 11/26/2015  2:47 PM  Visual Inspection  No deformities, no ulcerations, no other skin breakdown bilaterally:  Yes  Sensation Testing   Intact to touch and monofilament testing bilaterally:  Yes  Pulse Check  Posterior Tibialis and Dorsalis pulse intact bilaterally:  Yes  Comments        Assessment & Plan:   1. Annual physical exam Labs up to date.  Follow up in 6 months for re check. 2. Morbid obesity 3. HIV infection [facility] 4. Well-controlled type 2 diabetes This is improved the years with good diet and exercise regimen 5. Hypogonadism 6. Osteoarthritis 7. Hypertension 8. Hyperlipidemia I have done the exam and reviewed the above chart and it is accurate to the best of my knowledge.  Patient was seen and examined by Dr. Bosie Closichard L Brytnee Bechler and note was scribed by Samara DeistAnastasiya Aleksandrova, RMA.

## 2015-11-27 ENCOUNTER — Other Ambulatory Visit: Payer: Self-pay | Admitting: Family Medicine

## 2015-12-04 ENCOUNTER — Encounter: Payer: Self-pay | Admitting: Family Medicine

## 2015-12-05 ENCOUNTER — Telehealth: Payer: Self-pay

## 2015-12-05 DIAGNOSIS — Z202 Contact with and (suspected) exposure to infections with a predominantly sexual mode of transmission: Secondary | ICD-10-CM

## 2015-12-05 MED ORDER — AZITHROMYCIN 500 MG PO TABS: 1000.0000 mg | ORAL_TABLET | Freq: Every day | ORAL | Status: DC

## 2015-12-05 NOTE — Telephone Encounter (Signed)
[id number] 

## 2015-12-05 NOTE — Telephone Encounter (Signed)
My husband just had a physical and it came back that he is positive for Chlamydia         Would it be possible for me to get a round of antibiotics called [facility] on [address] and a script to be tested once the round is finished (this was recommended by his physician). Since I was just in last week for my physical, I was wondering if I could do this without another visit so soon.         Thanks,        Paul KaysBrian K Walker    13-Jan-2061

## 2015-12-05 NOTE — Telephone Encounter (Signed)
Ok with Rx but he does need to see me in next few weeks or even better this would be good for his ID doctors [facility].

## 2015-12-05 NOTE — Telephone Encounter (Signed)
Pt called in and wants to know if he is going to be able to get the treatment today that he requested. Can you review this since Dr.Gilbert is not here. Thanks (per [place]ast[place]) Please call him back at [phone]  Thanks, Fortune Brandsteri

## 2015-12-05 NOTE — Telephone Encounter (Signed)
Pt advised-aa 

## 2015-12-05 NOTE — Telephone Encounter (Signed)
Rx sent in but to be tested it either has to be a urethral swab (most accurate) or first urine of the day (or not have voided in 2 hours- can give false negatives). Dr. Reece AgarG may want to see him for this testing.

## 2015-12-19 ENCOUNTER — Encounter: Payer: Self-pay | Admitting: Family Medicine

## 2015-12-19 ENCOUNTER — Ambulatory Visit (INDEPENDENT_AMBULATORY_CARE_PROVIDER_SITE_OTHER): Payer: 59 | Admitting: Family Medicine

## 2015-12-19 VITALS — BP 110/54 | HR 64 | Temp 98.7°F | Resp 16 | Wt 275.0 lb

## 2015-12-19 DIAGNOSIS — Z202 Contact with and (suspected) exposure to infections with a predominantly sexual mode of transmission: Secondary | ICD-10-CM

## 2015-12-19 NOTE — Progress Notes (Signed)
Subjective:     Patient ID: Paul Walker, Paul Walker   DOB: 09-Aug-1960, 55 y.o.   MRN: [id number]  HPI  Chief Complaint  Patient presents with  . Exposure to STD    Patient comes into office requesting screening for STD, patient reports that 2 weeks ago his partner had tested positive for Chlamydia. Patient denies any penile lesions or burning with urination.   Has been treated with one gram of Zithromax. No symptoms reported. States his partner was negative for other STD's.   Review of Systems     Objective:   Physical Exam  Constitutional: He appears well-developed and well-nourished. No distress.       Assessment:    1. STD exposure - Chlamydia trachomatis, DNA, amp probe    Plan:    Further f/u pending lab result.

## 2015-12-19 NOTE — Patient Instructions (Signed)
We will call you with the lab results. 

## 2015-12-23 ENCOUNTER — Telehealth: Payer: Self-pay

## 2015-12-23 LAB — CHLAMYDIA TRACHOMATIS, DNA, AMP PROBE: Chlamydia trachomatis, NAA: NEGATIVE

## 2015-12-23 NOTE — Telephone Encounter (Signed)
Advised patient as below.  

## 2015-12-23 NOTE — Telephone Encounter (Signed)
Left message to call back  

## 2015-12-23 NOTE — Telephone Encounter (Signed)
-----   Message from Anola Gurneyobert Chauvin, [place] sent at 12/23/2015 10:57 AM EDT ----- Chlamydia test negative

## 2016-01-26 ENCOUNTER — Other Ambulatory Visit: Payer: Self-pay | Admitting: Family Medicine

## 2016-05-06 ENCOUNTER — Encounter: Payer: Self-pay | Admitting: Family Medicine

## 2016-05-06 ENCOUNTER — Ambulatory Visit (INDEPENDENT_AMBULATORY_CARE_PROVIDER_SITE_OTHER): Payer: 59 | Admitting: Family Medicine

## 2016-05-06 VITALS — BP 124/72 | HR 84 | Temp 98.7°F | Resp 16 | Wt 265.0 lb

## 2016-05-06 DIAGNOSIS — I10 Essential (primary) hypertension: Secondary | ICD-10-CM | POA: Diagnosis not present

## 2016-05-06 DIAGNOSIS — R739 Hyperglycemia, unspecified: Secondary | ICD-10-CM | POA: Diagnosis not present

## 2016-05-06 DIAGNOSIS — Z23 Encounter for immunization: Secondary | ICD-10-CM | POA: Diagnosis not present

## 2016-05-06 DIAGNOSIS — E784 Other hyperlipidemia: Secondary | ICD-10-CM

## 2016-05-06 DIAGNOSIS — E7849 Other hyperlipidemia: Secondary | ICD-10-CM

## 2016-05-06 DIAGNOSIS — Z6834 Body mass index (BMI) 34.0-34.9, adult: Secondary | ICD-10-CM | POA: Diagnosis not present

## 2016-05-06 DIAGNOSIS — E669 Obesity, unspecified: Secondary | ICD-10-CM | POA: Diagnosis not present

## 2016-05-06 DIAGNOSIS — B20 Human immunodeficiency virus [HIV] disease: Secondary | ICD-10-CM

## 2016-05-06 DIAGNOSIS — F900 Attention-deficit hyperactivity disorder, predominantly inattentive type: Secondary | ICD-10-CM

## 2016-05-06 LAB — POCT UA - MICROALBUMIN: MICROALBUMIN (UR) POC: 100 mg/L

## 2016-05-06 LAB — POCT GLYCOSYLATED HEMOGLOBIN (HGB A1C): Hemoglobin A1C: 5.3

## 2016-05-06 NOTE — Progress Notes (Signed)
Paul Walker  MRN: [id number] DOB: 09/16/1960  Subjective:  HPI  Patient is here for 6 months follow up.  Hypertension: checks b/p occasionally. No cardiac symptoms BP Readings from Last 3 Encounters:  05/06/16 124/72  12/19/15 (!) 110/54  11/26/15 108/66   Diabetes: patient does not check his sugar at home. Lab Results  Component Value Date   HGBA1C 5.4 09/08/2015   Last labs done in February 2017. Patient states [facility], will get those records. Patient Active Problem List   Diagnosis Date Noted  . Type 2 diabetes mellitus without complication, without long-term current use of insulin (HCC) 05/19/2015  . Anemia 12/21/2014  . H/O arthritis 12/21/2014  . Arthropathia 12/21/2014  . ADD (attention deficit hyperactivity disorder, inattentive type) 12/21/2014  . Bipolar II disorder (HCC) 12/21/2014  . Acid reflux 12/21/2014  . HIV (human immunodeficiency virus infection) (HCC) 12/21/2014  . HLD (hyperlipidemia) 12/21/2014  . 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 Affective disorder, major 12/21/2014  . Mononeuritis of lower limb 12/21/2014  . Adiposity 12/21/2014  . Fracture of femur (HCC) 06/13/2014  . Anxiety and depression 12/17/2013  . 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  Social History   Social History  . Marital status: Married    Spouse name: Paul Walker  . Number of children: 4  . Years of education: 6716   Occupational History  . [employer]    Social History Main Topics  . Smoking status: Never Smoker  . Smokeless tobacco: Never Used  . Alcohol use 1.8 - 2.4 oz/week    3 - 4 Standard drinks or equivalent per week     Comment: ocassionally-beers/wine  . Drug use: No  . Sexual activity: Yes    Birth control/ protection: Condom   Other Topics Concern  . Not on file   Social History Narrative  . No narrative on file    Outpatient Encounter Prescriptions as of 05/06/2016    Medication Sig Note  . amphetamine-dextroamphetamine (ADDERALL XR) 30 MG 24 hr capsule Take by mouth. 12/21/2014: Prescribed by Dr. Rolley SimsMatthew Walker, [facility]  . atorvastatin (LIPITOR) 20 MG tablet Take 1 tablet by mouth at  bedtime   . B COMPLEX VITAMINS PO Take by mouth. 12/21/2014: Received from: [facility]  . buPROPion (WELLBUTRIN SR) 150 MG 12 hr tablet  12/19/2015: Received from: External Pharmacy  . Cholecalciferol (D 10000) 10000 UNITS CAPS Take by mouth. 01/09/2015: [facility]  . CIALIS 5 MG tablet Take 1 tablet by mouth  daily   . dolutegravir (TIVICAY) 50 MG tablet Take by mouth. 12/21/2014: Received from: [facility]  . emtricitabine-tenofovir (TRUVADA) 200-300 MG per tablet Take by mouth. 12/21/2014: Received from: [facility]  . ferrous sulfate 325 (65 FE) MG tablet Take by mouth. 12/21/2014: Received from: [facility]  . lansoprazole (PREVACID) 30 MG capsule Take 1 capsule by mouth  daily   . losartan (COZAAR) 25 MG tablet Take 1 tablet by mouth  daily   . meloxicam (MOBIC) 15 MG tablet Take by mouth. 12/21/2014: Received from: [facility]  . metformin (FORTAMET) 1000 MG (OSM) 24 hr tablet Take 1 tablet by mouth two  times daily   . MULTIPLE VITAMIN PO Take by mouth. 12/21/2014: Received from: [facility]  . OMEGA-3 FATTY ACIDS PO Take by mouth. 12/21/2014: [facility] from: [facility]  . Testosterone 20.25 MG/ACT (1.62%) GEL Place onto the skin.   . [DISCONTINUED] cyanocobalamin 100 MCG tablet Take by mouth. 12/21/2014: Received from: [facility]   No facility-administered encounter medications on file as of 05/06/2016.     Allergies  Allergen Reactions  . Penicillins     Stopped breathing    Review of Systems  Constitutional: Negative.   HENT: Positive  for congestion.   Respiratory: Negative.   Cardiovascular: Negative.   Genitourinary: Positive for frequency.  Musculoskeletal: Positive for joint pain and myalgias.  Neurological: Negative.    Objective:  BP 124/72   Pulse 84   Temp 98.7 F (37.1 C)   Resp 16   Wt 265 lb (120.2 kg)   BMI 34.02 kg/m   Physical Exam  Constitutional: He is oriented to person, place, and time and well-developed, well-nourished, and in no distress.  HENT:  Head: Normocephalic and atraumatic.  Right Ear: External ear normal.  Left Ear: External ear normal.  Nose: Nose normal.  Mouth/Throat: Oropharynx is clear and moist.  Eyes: Conjunctivae are normal. Pupils are equal, round, and reactive to light.  Neck: Normal range of motion. Neck supple.  Cardiovascular: Normal rate, regular rhythm, normal heart sounds and intact distal pulses.   No murmur heard. Pulmonary/Chest: Effort normal and breath sounds normal. No respiratory distress. He has no wheezes.  Abdominal: Soft.  Musculoskeletal: He exhibits no edema or tenderness.  Neurological: He is alert and oriented to person, place, and time. No cranial nerve deficit. He exhibits normal muscle tone. Gait normal. Coordination normal. GCS score is 15.  Skin: Skin is warm and dry.  Psychiatric: Mood, memory, affect and judgment normal.    Assessment and Plan :  \1. Other hyperlipidemia Will re check labs on the next visit.  2. Essential hypertension Stable.   3. HIV (human immunodeficiency virus infection) (HCC) Follows [facility].  4. Hyperglycemia/Prediabetes A1C 5.3. Sugars are in the normal levels now, for the past 3 times A1C has been great. Will not stop Metformin for now. Will check A1C once a year at this point unless this starts getting worse again. - POCT HgB A1C - POCT UA - Microalbumin-100-he is on Losartan for kidney protection.  5. Class 1 obesity without serious comorbidity with body mass index (BMI) of 34.0 to 34.9 in adult,  unspecified obesity type Patient has been working out. Lost 10 lbs since may 2017. Encouraged patient to continue working out.  6. Attention deficit hyperactivity disorder (ADHD), predominantly inattentive type Stable.  7. Need for influenza vaccination - Flu Vaccine QUAD 36+ mos PF IM (Fluarix & Fluzone Quad PF)  HPI, Exam and A&P transcribed under direction and in the presence of Julieanne Manson, MD.

## 2016-07-01 ENCOUNTER — Other Ambulatory Visit: Payer: Self-pay | Admitting: Family Medicine

## 2016-08-01 ENCOUNTER — Other Ambulatory Visit: Payer: Self-pay | Admitting: Family Medicine

## 2016-09-01 ENCOUNTER — Other Ambulatory Visit: Payer: Self-pay | Admitting: Family Medicine

## 2016-10-18 ENCOUNTER — Other Ambulatory Visit: Payer: Self-pay | Admitting: Family Medicine

## 2016-11-29 ENCOUNTER — Ambulatory Visit (INDEPENDENT_AMBULATORY_CARE_PROVIDER_SITE_OTHER): Payer: 59 | Admitting: Family Medicine

## 2016-11-29 ENCOUNTER — Encounter: Payer: Self-pay | Admitting: Family Medicine

## 2016-11-29 VITALS — BP 128/82 | HR 92 | Temp 98.8°F | Resp 14 | Ht 74.5 in | Wt 275.0 lb

## 2016-11-29 DIAGNOSIS — K219 Gastro-esophageal reflux disease without esophagitis: Secondary | ICD-10-CM

## 2016-11-29 DIAGNOSIS — E784 Other hyperlipidemia: Secondary | ICD-10-CM | POA: Diagnosis not present

## 2016-11-29 DIAGNOSIS — F32A Depression, unspecified: Secondary | ICD-10-CM

## 2016-11-29 DIAGNOSIS — B20 Human immunodeficiency virus [HIV] disease: Secondary | ICD-10-CM

## 2016-11-29 DIAGNOSIS — Z0000 Encounter for general adult medical examination without abnormal findings: Secondary | ICD-10-CM

## 2016-11-29 DIAGNOSIS — F419 Anxiety disorder, unspecified: Secondary | ICD-10-CM

## 2016-11-29 DIAGNOSIS — R350 Frequency of micturition: Secondary | ICD-10-CM

## 2016-11-29 DIAGNOSIS — F329 Major depressive disorder, single episode, unspecified: Secondary | ICD-10-CM

## 2016-11-29 DIAGNOSIS — I10 Essential (primary) hypertension: Secondary | ICD-10-CM

## 2016-11-29 DIAGNOSIS — E7849 Other hyperlipidemia: Secondary | ICD-10-CM

## 2016-11-29 DIAGNOSIS — R739 Hyperglycemia, unspecified: Secondary | ICD-10-CM

## 2016-11-29 DIAGNOSIS — E291 Testicular hypofunction: Secondary | ICD-10-CM

## 2016-11-29 DIAGNOSIS — N529 Male erectile dysfunction, unspecified: Secondary | ICD-10-CM

## 2016-11-29 DIAGNOSIS — F900 Attention-deficit hyperactivity disorder, predominantly inattentive type: Secondary | ICD-10-CM

## 2016-11-29 DIAGNOSIS — N401 Enlarged prostate with lower urinary tract symptoms: Secondary | ICD-10-CM

## 2016-11-29 LAB — POCT URINALYSIS DIPSTICK
BILIRUBIN UA: NEGATIVE
Glucose, UA: NEGATIVE
KETONES UA: NEGATIVE
Leukocytes, UA: NEGATIVE
Nitrite, UA: NEGATIVE
PH UA: 6.5 (ref 5.0–8.0)
RBC UA: NEGATIVE
SPEC GRAV UA: 1.015 (ref 1.010–1.025)
Urobilinogen, UA: 0.2 E.U./dL

## 2016-11-29 MED ORDER — TAMSULOSIN HCL 0.4 MG PO CAPS: 0.4000 mg | ORAL_CAPSULE | Freq: Every day | ORAL | 3 refills | Status: DC

## 2016-11-29 NOTE — Progress Notes (Signed)
Patient: Paul Walker, Male    DOB: 1960/10/12, 56 y.o.   MRN: [id number] Visit Date: 11/29/2016  Today's Provider: [name], MD   Chief Complaint  Patient presents with  . Annual Exam   Subjective:  Paul Walker is a 56 y.o. male who presents today for health maintenance and complete physical. He feels fairly well. He reports exercising cardio, weight lifting, 6 days a week. He reports he is sleeping well. Patient sees urologist and rectal exam was done by them in 2018. He is gay and married to his male partner. He was previously married to his first wife and has 4 children and 3 grandchildren.he exercises regularly. Immunization History  Administered Date(s) Administered  . Influenza,inj,Quad PF,36+ Mos 05/06/2016  . Influenza-Unspecified 06/07/2015  . Pneumococcal Conjugate-13 09/21/2012  . Pneumococcal Polysaccharide-23 10/01/2010  . Tdap 07/04/2014    last colonoscopy was 01/13/15 normal except stool in entire colon, repeat in 3 years due to this. Review of Systems  Constitutional: Positive for fatigue.  HENT: Negative.   Eyes: Negative.   Respiratory: Negative.   Cardiovascular: Negative.   Gastrointestinal: Negative.   Endocrine: Negative.   Genitourinary: Positive for frequency.  Musculoskeletal: Positive for arthralgias and neck stiffness.  Skin: Negative.   Allergic/Immunologic: Negative.   Neurological: Negative.   Hematological: Negative.   Psychiatric/Behavioral: Positive for decreased concentration.    Social History   Social History  . Marital status: Married    Spouse name: Paul Walker  . Number of children: 4  . Years of education: 58   Occupational History  . [employer]    Social History Main Topics  . Smoking status: Never Smoker  . Smokeless tobacco: Never Used  . Alcohol use 1.8 - 2.4 oz/week    3 - 4 Standard drinks or equivalent per week     Comment: ocassionally-beers/wine  . Drug use: No  . Sexual activity: Yes    Birth  control/ protection: Condom   Other Topics Concern  . Not on file   Social History Narrative  . No narrative on file    Patient Active Problem List   Diagnosis Date Noted  . Anemia 12/21/2014  . H/O arthritis 12/21/2014  . Arthropathia 12/21/2014  . Attention deficit hyperactivity disorder (ADHD), predominantly inattentive type 12/21/2014  . Bipolar II disorder (HCC) 12/21/2014  . Acid reflux 12/21/2014  . HIV (human immunodeficiency virus infection) (HCC) 12/21/2014  . HLD (hyperlipidemia) 12/21/2014  . 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 Affective disorder, major 12/21/2014  . Mononeuritis of lower limb 12/21/2014  . Adiposity 12/21/2014  . Fracture of femur (HCC) 06/13/2014  . Anxiety and depression 12/17/2013  . ED (erectile dysfunction) of organic origin 06/19/2011    Past Surgical History:  Procedure Laterality Date  . KNEE SURGERY Right    arthroscopy and replacement  . TOTAL HIP ARTHROPLASTY Bilateral   . VASECTOMY      His family history includes Aplastic anemia in his mother; Colon cancer in his father; Heart attack in his paternal grandfather; Hypertension in his mother; Obesity in his brother; Stroke in his paternal grandmother.     Outpatient Encounter Prescriptions as of 11/29/2016  Medication Sig Note  . amphetamine-dextroamphetamine (ADDERALL XR) 30 MG 24 hr capsule Take by mouth. 12/21/2014: Prescribed by Dr. Rolley Sims, Psychiatrist, [facility] Received from: [facility]  . atorvastatin (LIPITOR) 20 MG tablet TAKE 1 TABLET BY MOUTH AT  BEDTIME   . B COMPLEX VITAMINS PO Take by mouth. 12/21/2014: Received from:  Acupuncturist  . buPROPion (WELLBUTRIN SR) 150 MG 12 hr tablet  12/19/2015: Received from: External Pharmacy  . Cholecalciferol (D 10000) 10000 UNITS CAPS Take by mouth. 01/09/2015: Received from: [facility]  . CIALIS 5 MG tablet TAKE 1 TABLET BY MOUTH  DAILY   . dolutegravir (TIVICAY) 50 MG tablet Take by  mouth. 12/21/2014: Received from: [facility]  . emtricitabine-tenofovir (TRUVADA) 200-300 MG per tablet Take by mouth. 12/21/2014: Received from: [facility]  . ferrous sulfate 325 (65 FE) MG tablet Take by mouth. 12/21/2014: Received from: [facility]  . finasteride (PROSCAR) 5 MG tablet Take 1 tablet by mouth daily.   . lansoprazole (PREVACID) 30 MG capsule TAKE 1 CAPSULE BY MOUTH  DAILY   . losartan (COZAAR) 25 MG tablet TAKE 1 TABLET BY MOUTH  DAILY   . meloxicam (MOBIC) 15 MG tablet Take by mouth. 12/21/2014: Received from: [facility]  . metformin (FORTAMET) 1000 MG (OSM) 24 hr tablet TAKE 1 TABLET BY MOUTH TWO  TIMES DAILY   . MULTIPLE VITAMIN PO Take by mouth. 12/21/2014: Received from: [facility]  . MYRBETRIQ 25 MG TB24 tablet Take 1 tablet by mouth daily.   . OMEGA-3 FATTY ACIDS PO Take by mouth. 12/21/2014: Received from: [facility]  . Testosterone 20.25 MG/ACT (1.62%) GEL Place onto the skin.    No facility-administered encounter medications on file as of 11/29/2016.     Patient Care Team: Maple Hudson., MD as PCP - General (Family Medicine)      Objective:   Vitals:  Vitals:   11/29/16 0919  BP: 128/82  Pulse: 92  Resp: 14  Temp: 98.8 F (37.1 C)  Weight: 275 lb (124.7 kg)  Height: 6' 2.5" (1.892 m)    Physical Exam  Constitutional: He is oriented to person, place, and time. He appears well-developed and well-nourished.  HENT:  Head: Normocephalic and atraumatic.  Right Ear: External ear normal.  Left Ear: External ear normal.  Mouth/Throat: Oropharynx is clear and moist.  Eyes: Conjunctivae are normal. Pupils are equal, round, and reactive to light.  Neck: Normal range of motion. Neck supple.  Cardiovascular: Normal rate, regular rhythm, normal heart sounds and intact distal pulses.  Exam reveals no gallop.   No murmur heard. Pulmonary/Chest:  Effort normal and breath sounds normal. No respiratory distress.  Abdominal: Soft. Bowel sounds are normal. He exhibits no distension. There is no tenderness. There is no rebound.  Musculoskeletal: He exhibits no edema or tenderness.  Neurological: He is alert and oriented to person, place, and time. No cranial nerve deficit. Coordination normal.  Skin: No rash noted. No erythema.  Psychiatric: He has a normal mood and affect. His behavior is normal. Judgment and thought content normal.  Discussed health benefits of physical activity, and encouraged him to engage in regular exercise appropriate for his age and condition.    Depression Screen PHQ 2/9 Scores 11/29/2016 06/09/2015  PHQ - 2 Score 1 0  PHQ- 9 Score 3 -    Assessment & Plan:  1. Annual physical exam Sees urologist. Defer DRE. - CBC w/Diff/Platelet - Lipid Panel With LDL/HDL Ratio - Comprehensive metabolic panel - TSH - POCT Urinalysis Dipstick  2. Essential hypertension  3. Other hyperlipidemia - Lipid Panel With LDL/HDL Ratio - Comprehensive metabolic panel  4. Hyperglycemia - HgB A1c  5. Anxiety and depression stable  6. Gastroesophageal reflux disease, esophagitis presence not specified stable 7. Eunuchoidism  Follows urologist.  8. ED (erectile dysfunction) of organic origin  9. Attention deficit hyperactivity disorder (ADHD), predominantly inattentive type 10. Benign prostatic hyperplasia with urinary frequency Add Tamsulosin. Sounds more like an OAB but will try this medication. Advised patient if after a few days of taking this medication he does not notice relief he can stop taking it. - tamsulosin (FLOMAX) 0.4 MG CAPS capsule; Take 1 capsule (0.4 mg total) by mouth daily.  Dispense: 90 capsule; Refill: 3  11. HIV (human immunodeficiency virus infection) (HCC)  HPI, Exam and A&Paul Walker, RMA under direction and in the presence of Paul Manson, MD. I have done the exam  and reviewed the chart and it is accurate to the best of my knowledge. Dentist has been used and  any errors in dictation or transcription are unintentional. Paul Walker M.D. [facility]

## 2016-12-04 LAB — CBC WITH DIFFERENTIAL/PLATELET
BASOS: 0 %
Basophils Absolute: 0 10*3/uL (ref 0.0–0.2)
EOS (ABSOLUTE): 0.1 10*3/uL (ref 0.0–0.4)
EOS: 2 %
HEMATOCRIT: 46 % (ref 37.5–51.0)
Hemoglobin: 15.6 g/dL (ref 13.0–17.7)
IMMATURE GRANS (ABS): 0 10*3/uL (ref 0.0–0.1)
IMMATURE GRANULOCYTES: 0 %
LYMPHS: 41 %
Lymphocytes Absolute: 1.6 10*3/uL (ref 0.7–3.1)
MCH: 31.8 pg (ref 26.6–33.0)
MCHC: 33.9 g/dL (ref 31.5–35.7)
MCV: 94 fL (ref 79–97)
MONOCYTES: 12 %
Monocytes Absolute: 0.5 10*3/uL (ref 0.1–0.9)
NEUTROS PCT: 45 %
Neutrophils Absolute: 1.7 10*3/uL (ref 1.4–7.0)
Platelets: 184 10*3/uL (ref 150–379)
RBC: 4.91 x10E6/uL (ref 4.14–5.80)
RDW: 13.6 % (ref 12.3–15.4)
WBC: 3.8 10*3/uL (ref 3.4–10.8)

## 2016-12-04 LAB — HEMOGLOBIN A1C
Est. average glucose Bld gHb Est-mCnc: 100 mg/dL
HEMOGLOBIN A1C: 5.1 % (ref 4.8–5.6)

## 2016-12-04 LAB — COMPREHENSIVE METABOLIC PANEL
A/G RATIO: 1.8 (ref 1.2–2.2)
ALT: 44 IU/L (ref 0–44)
AST: 36 IU/L (ref 0–40)
Albumin: 4.7 g/dL (ref 3.5–5.5)
Alkaline Phosphatase: 62 IU/L (ref 39–117)
BILIRUBIN TOTAL: 0.3 mg/dL (ref 0.0–1.2)
BUN/Creatinine Ratio: 19 (ref 9–20)
BUN: 27 mg/dL — ABNORMAL HIGH (ref 6–24)
CHLORIDE: 99 mmol/L (ref 96–106)
CO2: 25 mmol/L (ref 18–29)
Calcium: 10 mg/dL (ref 8.7–10.2)
Creatinine, Ser: 1.39 mg/dL — ABNORMAL HIGH (ref 0.76–1.27)
GFR calc Af Amer: 66 mL/min/{1.73_m2} (ref >59)
GFR calc non Af Amer: 57 mL/min/{1.73_m2} — ABNORMAL LOW (ref >59)
GLOBULIN, TOTAL: 2.6 g/dL (ref 1.5–4.5)
Glucose: 112 mg/dL — ABNORMAL HIGH (ref 65–99)
POTASSIUM: 4.3 mmol/L (ref 3.5–5.2)
SODIUM: 140 mmol/L (ref 134–144)
Total Protein: 7.3 g/dL (ref 6.0–8.5)

## 2016-12-04 LAB — LIPID PANEL WITH LDL/HDL RATIO
CHOLESTEROL TOTAL: 154 mg/dL (ref 100–199)
HDL: 38 mg/dL — AB (ref >39)
LDL CALC: 91 mg/dL (ref 0–99)
LDL/HDL RATIO: 2.4 ratio (ref 0.0–3.6)
Triglycerides: 127 mg/dL (ref 0–149)
VLDL CHOLESTEROL CAL: 25 mg/dL (ref 5–40)

## 2016-12-04 LAB — TSH: TSH: 2.45 u[IU]/mL (ref 0.450–4.500)

## 2016-12-13 ENCOUNTER — Other Ambulatory Visit: Payer: Self-pay | Admitting: Family Medicine

## 2016-12-13 DIAGNOSIS — R350 Frequency of micturition: Principal | ICD-10-CM

## 2016-12-13 DIAGNOSIS — N401 Enlarged prostate with lower urinary tract symptoms: Secondary | ICD-10-CM

## 2016-12-13 NOTE — Telephone Encounter (Signed)
[facility] faxed a request for the following medication. Thanks CC  tamsulosin (FLOMAX) 0.4 MG CAPS capsule

## 2016-12-14 MED ORDER — TAMSULOSIN HCL 0.4 MG PO CAPS: 0.4000 mg | ORAL_CAPSULE | Freq: Every day | ORAL | 3 refills | Status: DC

## 2016-12-14 NOTE — Telephone Encounter (Signed)
[facility] to [facility]

## 2017-03-02 ENCOUNTER — Other Ambulatory Visit: Payer: Self-pay | Admitting: Family Medicine

## 2017-03-24 ENCOUNTER — Encounter: Payer: Self-pay | Admitting: Family Medicine

## 2017-03-24 ENCOUNTER — Ambulatory Visit (INDEPENDENT_AMBULATORY_CARE_PROVIDER_SITE_OTHER): Payer: 59 | Admitting: Family Medicine

## 2017-03-24 VITALS — BP 118/68 | HR 98 | Temp 98.0°F | Resp 16 | Ht 75.0 in | Wt 272.0 lb

## 2017-03-24 DIAGNOSIS — E784 Other hyperlipidemia: Secondary | ICD-10-CM | POA: Diagnosis not present

## 2017-03-24 DIAGNOSIS — Z6834 Body mass index (BMI) 34.0-34.9, adult: Secondary | ICD-10-CM | POA: Diagnosis not present

## 2017-03-24 DIAGNOSIS — E669 Obesity, unspecified: Secondary | ICD-10-CM | POA: Diagnosis not present

## 2017-03-24 DIAGNOSIS — I10 Essential (primary) hypertension: Secondary | ICD-10-CM

## 2017-03-24 DIAGNOSIS — E7849 Other hyperlipidemia: Secondary | ICD-10-CM

## 2017-03-24 NOTE — Progress Notes (Signed)
Patient: Paul Walker Male    DOB: 1961/03/24   56 y.o.   MRN: [id number] Visit Date: 03/24/2017  Today's Provider: [name], MD   Chief Complaint  Patient presents with  . Obesity   Subjective:    HPI Pt is here today to have an appeals form filled out for his employer. His BMI is too high according to his forms. Pt reports that he exercises 6-7 times a week for an hour. He does cardio and weights. He has been doing low carb diet for about a year now. His BMI is 34.0 today. He is tracking his food and eats under 1800 calories a day.     Allergies  Allergen Reactions  . Penicillins     Stopped breathing     Current Outpatient Prescriptions:  .  amphetamine-dextroamphetamine (ADDERALL XR) 30 MG 24 hr capsule, Take by mouth., Disp: , Rfl:  .  atorvastatin (LIPITOR) 20 MG tablet, TAKE 1 TABLET BY MOUTH AT  BEDTIME, Disp: 90 tablet, Rfl: 3 .  B COMPLEX VITAMINS PO, Take by mouth., Disp: , Rfl:  .  buPROPion (WELLBUTRIN SR) 150 MG 12 hr tablet, , Disp: , Rfl:  .  Cholecalciferol (D 10000) 10000 UNITS CAPS, Take by mouth., Disp: , Rfl:  .  CIALIS 5 MG tablet, TAKE 1 TABLET BY MOUTH  DAILY, Disp: 90 tablet, Rfl: 3 .  dolutegravir (TIVICAY) 50 MG tablet, Take by mouth., Disp: , Rfl:  .  emtricitabine-tenofovir (TRUVADA) 200-300 MG per tablet, Take by mouth., Disp: , Rfl:  .  ferrous sulfate 325 (65 FE) MG tablet, Take by mouth., Disp: , Rfl:  .  finasteride (PROSCAR) 5 MG tablet, Take 1 tablet by mouth daily., Disp: , Rfl:  .  lansoprazole (PREVACID) 30 MG capsule, TAKE 1 CAPSULE BY MOUTH  DAILY, Disp: 90 capsule, Rfl: 3 .  losartan (COZAAR) 25 MG tablet, TAKE 1 TABLET BY MOUTH  DAILY, Disp: 90 tablet, Rfl: 3 .  meloxicam (MOBIC) 15 MG tablet, Take by mouth., Disp: , Rfl:  .  metformin (FORTAMET) 1000 MG (OSM) 24 hr tablet, TAKE 1 TABLET BY MOUTH TWO  TIMES DAILY, Disp: 180 tablet, Rfl: 3 .  MULTIPLE VITAMIN PO, Take by mouth., Disp: , Rfl:  .  MYRBETRIQ 25 MG TB24  tablet, Take 1 tablet by mouth daily., Disp: , Rfl:  .  OMEGA-3 FATTY ACIDS PO, Take by mouth., Disp: , Rfl:  .  tamsulosin (FLOMAX) 0.4 MG CAPS capsule, Take 1 capsule (0.4 mg total) by mouth daily., Disp: 90 capsule, Rfl: 3 .  Testosterone 20.25 MG/ACT (1.62%) GEL, Place onto the skin., Disp: , Rfl:   Review of Systems  Constitutional: Negative.   HENT: Negative.   Eyes: Negative.   Respiratory: Negative.   Cardiovascular: Negative.   Gastrointestinal: Negative.   Endocrine: Negative.   Genitourinary: Negative.   Musculoskeletal: Negative.   Skin: Negative.   Allergic/Immunologic: Negative.   Neurological: Negative.   Hematological: Negative.   Psychiatric/Behavioral: Negative.     Social History  Substance Use Topics  . Smoking status: Never Smoker  . Smokeless tobacco: Never Used  . Alcohol use 1.8 - 2.4 oz/week    3 - 4 Standard drinks or equivalent per week     Comment: ocassionally-beers/wine   Objective:   BP 118/68 (BP Location: Left Arm, Patient Position: Sitting, Cuff Size: Large)   Pulse 98   Temp 98 F (36.7 C) (Oral)   Resp  16   Ht 6\' 3"  (1.905 m)   Wt 272 lb (123.4 kg)   BMI 34.00 kg/m  Vitals:   03/24/17 0815  BP: 118/68  Pulse: 98  Resp: 16  Temp: 98 F (36.7 C)  TempSrc: Oral  Weight: 272 lb (123.4 kg)  Height: 6\' 3"  (1.905 m)     Physical Exam  Constitutional: He is oriented to person, place, and time. He appears well-developed and well-nourished.  Eyes: Pupils are equal, round, and reactive to light. Conjunctivae and EOM are normal.  Neck: Normal range of motion. Neck supple.  Cardiovascular: Normal rate, regular rhythm, normal heart sounds and intact distal pulses.   Pulmonary/Chest: Effort normal and breath sounds normal.  Musculoskeletal: Normal range of motion.  Neurological: He is alert and oriented to person, place, and time. He has normal reflexes.  Skin: Skin is warm and dry.  Psychiatric: He has a normal mood and affect. His  behavior is normal. Judgment and thought content normal.        Assessment & Plan:     1. Class 1 obesity without serious comorbidity with body mass index (BMI) of 34.0 to 34.9 in adult, unspecified obesity type Discussed diet and exercise at length. Pt exercises 6-7 days a week and eats a low carb diet. Appeals form filled out for his insurance. Waist 42 inches  2. Other hyperlipidemia   3. Essential hypertension      HPI, Exam, and A&P Transcribed under the direction and in the presence of Richard L. Wendelyn Breslow, MD  Electronically Signed: Silvio Pate, CMA  I have done the exam and reviewed the above chart and it is accurate to the best of my knowledge. Dentist has been used in this note in any air is in the dictation or transcription are unintentional.  [name], MD  [facility]

## 2017-03-28 ENCOUNTER — Encounter: Payer: Self-pay | Admitting: Family Medicine

## 2017-04-12 ENCOUNTER — Other Ambulatory Visit: Payer: Self-pay | Admitting: Family Medicine

## 2017-04-26 ENCOUNTER — Other Ambulatory Visit: Payer: Self-pay | Admitting: Family Medicine

## 2017-05-05 ENCOUNTER — Encounter: Payer: Self-pay | Admitting: Family Medicine

## 2017-05-06 ENCOUNTER — Telehealth: Payer: Self-pay

## 2017-05-06 NOTE — Telephone Encounter (Signed)
I have been informed by [facility] that the  metformin is no longer covered under my insurance, however the  is covered. They suggested ordering it. Is this possible.  Above came through [place]. Patient is on Metformin 1000 mg 1 tablet twice daily but it is 24 hours tablet. How do we need to change this ?-Consuella Lose, RMA

## 2017-05-06 NOTE — Telephone Encounter (Signed)
Ok to change to 500--2 BID--thx

## 2017-05-07 MED ORDER — METFORMIN HCL 500 MG PO TABS: ORAL_TABLET | ORAL | 3 refills | Status: DC

## 2017-05-07 NOTE — Telephone Encounter (Signed)
RX sent in and patient advised through [employer], RMA

## 2017-05-30 ENCOUNTER — Ambulatory Visit (INDEPENDENT_AMBULATORY_CARE_PROVIDER_SITE_OTHER): Payer: 59 | Admitting: Family Medicine

## 2017-05-30 VITALS — BP 130/72 | HR 104 | Temp 98.6°F | Resp 14 | Wt 278.0 lb

## 2017-05-30 DIAGNOSIS — Z01818 Encounter for other preprocedural examination: Secondary | ICD-10-CM | POA: Diagnosis not present

## 2017-05-30 DIAGNOSIS — R9431 Abnormal electrocardiogram [ECG] [EKG]: Secondary | ICD-10-CM | POA: Diagnosis not present

## 2017-05-30 DIAGNOSIS — E119 Type 2 diabetes mellitus without complications: Secondary | ICD-10-CM | POA: Diagnosis not present

## 2017-05-30 NOTE — Progress Notes (Signed)
Paul Walker  MRN: [id number] DOB: June 25, 1961  Subjective:  HPI   The patient is a 56 year old male who presents for follow up of chronic disease.  He was last seen 03/24/17 for BMI appeal with work insurance.  His visit on 11/29/16 was for annual exam.    Diabetes/Hyperglycemia-His last A1C was done on 12/03/2016 and it was 5.1.  His insurance would not cover Metformin 1000 mg so he was instructed to go to 500 mg and take BID.  He does not check his glucose at home.  Hypertension-stable on last visit BP Readings from Last 3 Encounters:  05/30/17 130/72  03/24/17 118/68  11/29/16 128/82   Hyperlipidemia-Lipids have been stable. Lab Results  Component Value Date   CHOL 154 12/03/2016   CHOL 141 09/08/2015   CHOL 143 01/09/2015   Lab Results  Component Value Date   HDL 38 (L) 12/03/2016   HDL 41 09/08/2015   HDL 31 (L) 01/09/2015   Lab Results  Component Value Date   LDLCALC 91 12/03/2016   LDLCALC 80 09/08/2015   LDLCALC 73 01/09/2015   Lab Results  Component Value Date   TRIG 127 12/03/2016   TRIG 101 09/08/2015   TRIG 196 (H) 01/09/2015   No results found for: CHOLHDL No results found for: LDLDIRECT   Anxiety/Depression-stable on last visit Depression screen [facility] 2/9 03/24/2017 11/29/2016 06/09/2015  Decreased Interest 0 0 0  Down, Depressed, Hopeless 0 1 0  PHQ - 2 Score 0 1 0  Altered sleeping 1 0 -  Tired, decreased energy 1 1 -  Change in appetite 0 0 -  Feeling bad or failure about yourself  0 0 -  Trouble concentrating 1 1 -  Moving slowly or fidgety/restless 0 0 -  Suicidal thoughts 0 0 -  PHQ-9 Score 3 3 -  Difficult doing work/chores Not difficult at all - -    Patient has elective surgery in January and will need CBC, Met C and EKG done today.    Patient Active Problem List   Diagnosis Date Noted  . Anemia 12/21/2014  . H/O arthritis 12/21/2014  . Arthropathia 12/21/2014  . Attention deficit hyperactivity disorder (ADHD), predominantly  inattentive type 12/21/2014  . Bipolar II disorder ([place]) 12/21/2014  . Acid reflux 12/21/2014  . HIV (human immunodeficiency virus infection) ([place]) 12/21/2014  . HLD (hyperlipidemia) 12/21/2014  . 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 Affective disorder, major 12/21/2014  . Mononeuritis of lower limb 12/21/2014  . Adiposity 12/21/2014  . Fracture of femur ([place]) 06/13/2014  . Anxiety and depression 12/17/2013  . 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  Social History   Social History  . Marital status: Married    Spouse name: Lucita Lora  . Number of children: 4  . Years of education: 59   Occupational History  . [employer]    Social History Main Topics  . Smoking status: Never Smoker  . Smokeless tobacco: Never Used  . Alcohol use 1.8 - 2.4 oz/week    3 - 4 Standard drinks or equivalent per week     Comment: ocassionally-beers/wine  . Drug use: No  . Sexual activity: Yes    Birth control/ protection: Condom   Other Topics Concern  . Not on file   Social History Narrative  . No narrative on file    Outpatient Encounter Prescriptions as of 05/30/2017  Medication Sig Note  . amphetamine-dextroamphetamine (ADDERALL  XR) 30 MG 24 hr capsule Take by mouth. 12/21/2014: Prescribed by Dr. Jenness Corner, Psychiatrist, [facility] Received from: [facility]  . atorvastatin (LIPITOR) 20 MG tablet TAKE 1 TABLET BY MOUTH AT  BEDTIME   . B COMPLEX VITAMINS PO Take by mouth. 12/21/2014: Received from: [facility]  . bictegravir-emtricitabine-tenofovir AF (BIKTARVY) 50-200-25 MG TABS tablet Take by mouth daily.   Marland Kitchen buPROPion (WELLBUTRIN SR) 150 MG 12 hr tablet  12/19/2015: Received from: External Pharmacy  . Cholecalciferol (D 10000) 10000 UNITS CAPS Take by mouth. 01/09/2015: Received from: [facility]  . CIALIS 5 MG tablet TAKE 1 TABLET BY MOUTH  DAILY   . ferrous sulfate 325 (65 FE) MG tablet  Take by mouth. 12/21/2014: Received from: [facility]  . finasteride (PROSCAR) 5 MG tablet Take 1 tablet by mouth daily.   . lansoprazole (PREVACID) 30 MG capsule TAKE 1 CAPSULE BY MOUTH  DAILY   . losartan (COZAAR) 25 MG tablet TAKE 1 TABLET BY MOUTH  DAILY   . meloxicam (MOBIC) 15 MG tablet Take by mouth. 12/21/2014: Received from: [facility]  . metFORMIN (GLUCOPHAGE) 500 MG tablet 2 tablets twice daily   . MULTIPLE VITAMIN PO Take by mouth. 12/21/2014: Received from: [facility]  . MYRBETRIQ 25 MG TB24 tablet Take 1 tablet by mouth daily.   . OMEGA-3 FATTY ACIDS PO Take by mouth. 12/21/2014: Received from: [facility]  . tamsulosin (FLOMAX) 0.4 MG CAPS capsule Take 1 capsule (0.4 mg total) by mouth daily.   . Testosterone 20.25 MG/ACT (1.62%) GEL Place onto the skin.   . [DISCONTINUED] dolutegravir (TIVICAY) 50 MG tablet Take by mouth. 12/21/2014: Received from: [facility]  . [DISCONTINUED] emtricitabine-tenofovir (TRUVADA) 200-300 MG per tablet Take by mouth. 12/21/2014: Received from: [facility]   No facility-administered encounter medications on file as of 05/30/2017.     Allergies  Allergen Reactions  . Penicillins     Stopped breathing    Review of Systems  Constitutional: Negative.   HENT: Negative.   Respiratory: Negative.   Cardiovascular: Negative.   Gastrointestinal: Negative.   Skin: Negative.   Neurological: Negative.   Endo/Heme/Allergies: Negative.   Psychiatric/Behavioral: Negative.     Objective:  BP 130/72 (BP Location: Right Arm, Patient Position: Sitting, Cuff Size: Normal)   Pulse (!) 104   Temp 98.6 F (37 C) (Oral)   Resp 14   Wt 278 lb (126.1 kg)   BMI 34.75 kg/m   Physical Exam  Constitutional: He is oriented to person, place, and time and well-developed, well-nourished, and in no distress.  HENT:  Head: Normocephalic and atraumatic.    Right Ear: External ear normal.  Left Ear: External ear normal.  Nose: Nose normal.  Mouth/Throat: Oropharynx is clear and moist.  Eyes: Conjunctivae are normal. No scleral icterus.  Neck: No thyromegaly present.  Cardiovascular: Normal rate, regular rhythm and normal heart sounds.   Pulmonary/Chest: Effort normal and breath sounds normal.  Abdominal: Soft.  Musculoskeletal: He exhibits no edema.  Neurological: He is alert and oriented to person, place, and time. Gait normal. GCS score is 15.  Skin: Skin is warm and dry.  Psychiatric: Mood, memory, affect and judgment normal.    Assessment and Plan :  TIIDM Excellent control. [facility] OA Intentional weight loss with resulting pannus  Patient medically clear for plastic surgery next month. MDD HTN HLD  I have done the exam and reviewed the chart  and it is accurate to the best of my knowledge. Development worker, community has been used and  any errors in dictation or transcription are unintentional. Miguel Aschoff M.D. [facility]

## 2017-05-31 ENCOUNTER — Telehealth: Payer: Self-pay | Admitting: Family Medicine

## 2017-05-31 LAB — CBC WITH DIFFERENTIAL/PLATELET
BASOS ABS: 0 10*3/uL (ref 0.0–0.2)
Basos: 0 %
EOS (ABSOLUTE): 0 10*3/uL (ref 0.0–0.4)
EOS: 0 %
HEMOGLOBIN: 15.9 g/dL (ref 13.0–17.7)
Hematocrit: 45.9 % (ref 37.5–51.0)
IMMATURE GRANS (ABS): 0 10*3/uL (ref 0.0–0.1)
IMMATURE GRANULOCYTES: 0 %
LYMPHS: 20 %
Lymphocytes Absolute: 1.4 10*3/uL (ref 0.7–3.1)
MCH: 31.8 pg (ref 26.6–33.0)
MCHC: 34.6 g/dL (ref 31.5–35.7)
MCV: 92 fL (ref 79–97)
MONOCYTES: 6 %
Monocytes Absolute: 0.4 10*3/uL (ref 0.1–0.9)
NEUTROS PCT: 74 %
Neutrophils Absolute: 5.2 10*3/uL (ref 1.4–7.0)
Platelets: 195 10*3/uL (ref 150–379)
RBC: 5 x10E6/uL (ref 4.14–5.80)
RDW: 13.5 % (ref 12.3–15.4)
WBC: 7.1 10*3/uL (ref 3.4–10.8)

## 2017-05-31 LAB — COMPREHENSIVE METABOLIC PANEL
ALBUMIN: 4.6 g/dL (ref 3.5–5.5)
ALK PHOS: 66 IU/L (ref 39–117)
ALT: 30 IU/L (ref 0–44)
AST: 33 IU/L (ref 0–40)
Albumin/Globulin Ratio: 1.5 (ref 1.2–2.2)
BUN / CREAT RATIO: 17 (ref 9–20)
BUN: 24 mg/dL (ref 6–24)
Bilirubin Total: 0.3 mg/dL (ref 0.0–1.2)
CO2: 29 mmol/L (ref 20–29)
Calcium: 10 mg/dL (ref 8.7–10.2)
Chloride: 99 mmol/L (ref 96–106)
Creatinine, Ser: 1.41 mg/dL — ABNORMAL HIGH (ref 0.76–1.27)
GFR calc Af Amer: 65 mL/min/{1.73_m2} (ref >59)
GFR, EST NON AFRICAN AMERICAN: 56 mL/min/{1.73_m2} — AB (ref >59)
GLUCOSE: 110 mg/dL — AB (ref 65–99)
Globulin, Total: 3 g/dL (ref 1.5–4.5)
Potassium: 4.3 mmol/L (ref 3.5–5.2)
Sodium: 141 mmol/L (ref 134–144)
TOTAL PROTEIN: 7.6 g/dL (ref 6.0–8.5)

## 2017-05-31 LAB — SPECIMEN STATUS REPORT

## 2017-05-31 NOTE — Telephone Encounter (Signed)
Pt is returning call.  CB#[phone]

## 2017-06-01 LAB — SPECIMEN STATUS REPORT

## 2017-06-01 LAB — HGB A1C W/O EAG: Hgb A1c MFr Bld: 5.2 % (ref 4.8–5.6)

## 2017-06-07 LAB — SPECIMEN STATUS REPORT

## 2017-06-07 LAB — HEMOGLOBIN A1C
Est. average glucose Bld gHb Est-mCnc: 105 mg/dL
Hgb A1c MFr Bld: 5.3 % (ref 4.8–5.6)

## 2017-06-13 ENCOUNTER — Other Ambulatory Visit: Payer: Self-pay | Admitting: Family Medicine

## 2017-07-08 ENCOUNTER — Other Ambulatory Visit: Payer: Self-pay | Admitting: Family Medicine

## 2017-07-08 ENCOUNTER — Telehealth: Payer: Self-pay

## 2017-07-08 DIAGNOSIS — N401 Enlarged prostate with lower urinary tract symptoms: Secondary | ICD-10-CM

## 2017-07-08 DIAGNOSIS — R350 Frequency of micturition: Principal | ICD-10-CM

## 2017-07-08 NOTE — Telephone Encounter (Signed)
Open in error

## 2017-10-21 ENCOUNTER — Other Ambulatory Visit: Payer: Self-pay | Admitting: Family Medicine

## 2017-10-21 DIAGNOSIS — R350 Frequency of micturition: Principal | ICD-10-CM

## 2017-10-21 DIAGNOSIS — N401 Enlarged prostate with lower urinary tract symptoms: Secondary | ICD-10-CM

## 2018-05-23 ENCOUNTER — Other Ambulatory Visit: Payer: Self-pay | Admitting: Family Medicine

## 2018-06-02 ENCOUNTER — Other Ambulatory Visit: Payer: Self-pay | Admitting: Family Medicine

## 2018-06-02 DIAGNOSIS — N401 Enlarged prostate with lower urinary tract symptoms: Secondary | ICD-10-CM

## 2018-06-02 DIAGNOSIS — R350 Frequency of micturition: Principal | ICD-10-CM

## 2018-07-15 ENCOUNTER — Other Ambulatory Visit: Payer: Self-pay | Admitting: Family Medicine

## 2018-07-30 ENCOUNTER — Other Ambulatory Visit: Payer: Self-pay | Admitting: Family Medicine

## 2018-09-20 ENCOUNTER — Other Ambulatory Visit: Payer: Self-pay | Admitting: Family Medicine

## 2018-09-20 MED ORDER — TADALAFIL 5 MG PO TABS: 5.0000 mg | ORAL_TABLET | Freq: Every day | ORAL | 3 refills | Status: DC

## 2018-09-20 NOTE — Telephone Encounter (Signed)
Please review. Thanks!  

## 2018-09-20 NOTE — Telephone Encounter (Signed)
[facility] faxed refill request for the following medications:  tadalafil (CIALIS) 5 MG tablet   Please advise.

## 2019-02-06 ENCOUNTER — Other Ambulatory Visit: Payer: Self-pay | Admitting: Family Medicine

## 2019-02-26 ENCOUNTER — Other Ambulatory Visit: Payer: Self-pay | Admitting: Family Medicine

## 2019-02-26 DIAGNOSIS — N401 Enlarged prostate with lower urinary tract symptoms: Secondary | ICD-10-CM

## 2019-04-08 ENCOUNTER — Other Ambulatory Visit: Payer: Self-pay | Admitting: Family Medicine

## 2019-04-29 ENCOUNTER — Other Ambulatory Visit: Payer: Self-pay | Admitting: Family Medicine

## 2019-04-29 DIAGNOSIS — N401 Enlarged prostate with lower urinary tract symptoms: Secondary | ICD-10-CM

## 2019-04-30 ENCOUNTER — Other Ambulatory Visit: Payer: Self-pay

## 2019-04-30 NOTE — Telephone Encounter (Signed)
Pharmacy requesting refills. Patient LOV was 2018.

## 2019-05-03 ENCOUNTER — Other Ambulatory Visit: Payer: Self-pay

## 2019-05-03 ENCOUNTER — Encounter: Payer: Self-pay | Admitting: Family Medicine

## 2019-05-03 ENCOUNTER — Ambulatory Visit (INDEPENDENT_AMBULATORY_CARE_PROVIDER_SITE_OTHER): Payer: Managed Care, Other (non HMO) | Admitting: Family Medicine

## 2019-05-03 VITALS — BP 121/73 | HR 68 | Temp 96.9°F | Resp 18 | Wt 248.0 lb

## 2019-05-03 DIAGNOSIS — E785 Hyperlipidemia, unspecified: Secondary | ICD-10-CM

## 2019-05-03 DIAGNOSIS — Z23 Encounter for immunization: Secondary | ICD-10-CM

## 2019-05-03 DIAGNOSIS — E119 Type 2 diabetes mellitus without complications: Secondary | ICD-10-CM | POA: Diagnosis not present

## 2019-05-03 DIAGNOSIS — Z0000 Encounter for general adult medical examination without abnormal findings: Secondary | ICD-10-CM | POA: Diagnosis not present

## 2019-05-03 DIAGNOSIS — D649 Anemia, unspecified: Secondary | ICD-10-CM

## 2019-05-03 DIAGNOSIS — Z21 Asymptomatic human immunodeficiency virus [HIV] infection status: Secondary | ICD-10-CM

## 2019-05-03 DIAGNOSIS — Z6831 Body mass index (BMI) 31.0-31.9, adult: Secondary | ICD-10-CM

## 2019-05-03 DIAGNOSIS — E6609 Other obesity due to excess calories: Secondary | ICD-10-CM

## 2019-05-03 DIAGNOSIS — F3181 Bipolar II disorder: Secondary | ICD-10-CM

## 2019-05-03 LAB — POCT URINALYSIS DIPSTICK
Bilirubin, UA: NEGATIVE
Blood, UA: NEGATIVE
Glucose, UA: NEGATIVE
Ketones, UA: NEGATIVE
Leukocytes, UA: NEGATIVE
Nitrite, UA: NEGATIVE
Protein, UA: NEGATIVE
Spec Grav, UA: 1.01 (ref 1.010–1.025)
Urobilinogen, UA: 0.2 E.U./dL
pH, UA: 6.5 (ref 5.0–8.0)

## 2019-05-03 LAB — POCT UA - MICROALBUMIN: Microalbumin Ur, POC: NEGATIVE mg/L

## 2019-05-03 MED ORDER — LOSARTAN POTASSIUM 25 MG PO TABS: 25.0000 mg | ORAL_TABLET | Freq: Every day | ORAL | 3 refills | Status: DC

## 2019-05-03 NOTE — Progress Notes (Signed)
Patient: Paul Walker, Male    DOB: 1960/12/04, 58 y.o.   MRN: [id number] Visit Date: 05/03/2019  Today's Provider: Megan Mansichard Gilbert Jr, MD   Chief Complaint  Patient presents with  . Annual Exam   Subjective:     Annual physical exam Paul Walker is a 58 y.o. male who presents today for health maintenance and complete physical. He feels well. He reports exercising by walking and using the elliptical macine. He reports he is sleeping well. Medication refill Tadalafil, and Tamsulosin.  Patient is gay and is married to his partner.  He has HIV treated by [facility] and is doing well.  He has 4 children for Ms. first marriage and he has grandchildren that he see sometimes. -----------------------------------------------------------------   Review of Systems  Constitutional: Negative.   HENT: Negative.   Eyes: Negative.   Respiratory: Negative.   Cardiovascular: Negative.   Gastrointestinal: Negative.   Endocrine: Negative.   Genitourinary: Negative.   Musculoskeletal: Positive for arthralgias.       Mild left shoulder pain with certain movements.  Skin: Negative.   Allergic/Immunologic: Negative.   Neurological: Negative.   Hematological: Negative.   Psychiatric/Behavioral: Negative.     Social History      He  reports that he has never smoked. He has never used smokeless tobacco. He reports current alcohol use of about 3.0 - 4.0 standard drinks of alcohol per week. He reports that he does not use drugs.       Social History   Socioeconomic History  . Marital status: Married    Spouse name: Prescott ParmaKenneth Garner  . Number of children: 4  . Years of education: 3616  . Highest education level: Not on file  Occupational History  . Occupation: [employer]  Social Needs  . Financial resource strain: Not on file  . Food insecurity    Worry: Not on file    Inability: Not on file  . Transportation needs    Medical: Not on file    Non-medical: Not on file  Tobacco Use  .  Smoking status: Never Smoker  . Smokeless tobacco: Never Used  Substance and Sexual Activity  . Alcohol use: Yes    Alcohol/week: 3.0 - 4.0 standard drinks    Types: 3 - 4 Standard drinks or equivalent per week    Comment: ocassionally-beers/wine  . Drug use: No  . Sexual activity: Yes    Birth control/protection: Condom  Lifestyle  . Physical activity    Days per week: Not on file    Minutes per session: Not on file  . Stress: Not on file  Relationships  . Social Musicianconnections    Talks on phone: Not on file    Gets together: Not on file    Attends religious service: Not on file    Active member of club or organization: Not on file    Attends meetings of clubs or organizations: Not on file    Relationship status: Not on file  Other Topics Concern  . Not on file  Social History Narrative  . Not on file    No past medical history on file.   Patient Active Problem List   Diagnosis Date Noted  . Anemia 12/21/2014  . H/O arthritis 12/21/2014  . Arthropathia 12/21/2014  . Attention deficit hyperactivity disorder (ADHD), predominantly inattentive type 12/21/2014  . Bipolar II disorder (H[facility]) 12/21/2014  . Acid reflux 12/21/2014  . HIV (human immunodeficiency virus infection) ([facility])  12/21/2014  . HLD (hyperlipidemia) 12/21/2014  . 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 Affective disorder, major 12/21/2014  . Mononeuritis of lower limb 12/21/2014  . Adiposity 12/21/2014  . Fracture of femur ([facility]) 06/13/2014  . Anxiety and depression 12/17/2013  . ED (erectile dysfunction) of organic origin 06/19/2011    Past Surgical History:  Procedure Laterality Date  . KNEE SURGERY Right    arthroscopy and replacement  . TOTAL HIP ARTHROPLASTY Bilateral   . VASECTOMY      Family History        Family Status  Relation Name Status  . Mother  Deceased  . Father  Deceased       cancer metastasized to brain lung liver  . Brother  Alive  . PGM  (Not Specified)  . PGF  (Not Specified)         His family history includes Aplastic anemia in his mother; Colon cancer in his father; Heart attack in his paternal grandfather; Hypertension in his mother; Obesity in his brother; Stroke in his paternal grandmother.      Allergies  Allergen Reactions  . Penicillins     Stopped breathing     Current Outpatient Medications:  .  amphetamine-dextroamphetamine (ADDERALL XR) 30 MG 24 hr capsule, Take by mouth., Disp: , Rfl:  .  atorvastatin (LIPITOR) 20 MG tablet, TAKE 1 TABLET BY MOUTH AT  BEDTIME, Disp: 90 tablet, Rfl: 0 .  B COMPLEX VITAMINS PO, Take by mouth., Disp: , Rfl:  .  bictegravir-emtricitabine-tenofovir AF (BIKTARVY) 50-200-25 MG TABS tablet, Take by mouth daily., Disp: , Rfl:  .  buPROPion (WELLBUTRIN SR) 150 MG 12 hr tablet, , Disp: , Rfl:  .  Cholecalciferol (D 10000) 10000 UNITS CAPS, Take by mouth., Disp: , Rfl:  .  ferrous sulfate 325 (65 FE) MG tablet, Take by mouth., Disp: , Rfl:  .  finasteride (PROSCAR) 5 MG tablet, Take 1 tablet by mouth daily., Disp: , Rfl:  .  lansoprazole (PREVACID) 30 MG capsule, TAKE 1 CAPSULE BY MOUTH  DAILY, Disp: 90 capsule, Rfl: 3 .  meloxicam (MOBIC) 15 MG tablet, Take by mouth., Disp: , Rfl:  .  metFORMIN (GLUCOPHAGE) 500 MG tablet, TAKE 2 TABLETS BY MOUTH  TWICE A DAY, Disp: 360 tablet, Rfl: 3 .  MULTIPLE VITAMIN PO, Take by mouth., Disp: , Rfl:  .  MYRBETRIQ 25 MG TB24 tablet, Take 1 tablet by mouth daily., Disp: , Rfl:  .  OMEGA-3 FATTY ACIDS PO, Take by mouth., Disp: , Rfl:  .  losartan (COZAAR) 25 MG tablet, Take 1 tablet (25 mg total) by mouth daily., Disp: 90 tablet, Rfl: 3 .  tadalafil (CIALIS) 5 MG tablet, TAKE 1 TABLET BY MOUTH  DAILY, Disp: 90 tablet, Rfl: 3 .  tamsulosin (FLOMAX) 0.4 MG CAPS capsule, TAKE 1 CAPSULE BY MOUTH  DAILY, Disp: 30 capsule, Rfl: 11 .  Testosterone 20.25 MG/ACT (1.62%) GEL, Place onto the skin., Disp: , Rfl:    Patient Care Team: Maple Hudson., MD as PCP - General (Family Medicine)     Objective:    Vitals: BP 121/73 (BP Location: Right Arm, Patient Position: Sitting, Cuff Size: Large)   Pulse 68   Temp (!) 96.9 F (36.1 C) (Temporal)   Resp 18   Wt 248 lb (112.5 kg)   SpO2 99%   BMI 31.00 kg/m    Vitals:   05/03/19 1401  BP: 121/73  Pulse: 68  Resp: 18  Temp: (!) 96.9 F (36.1  C)  TempSrc: Temporal  SpO2: 99%  Weight: 248 lb (112.5 kg)     Physical Exam Vitals signs reviewed.  Constitutional:      Appearance: Normal appearance.  HENT:     Head: Normocephalic and atraumatic.     Right Ear: Tympanic membrane, ear canal and external ear normal.     Left Ear: Tympanic membrane, ear canal and external ear normal.     Nose: Nose normal.  Eyes:     General: No scleral icterus.    Conjunctiva/sclera: Conjunctivae normal.  Neck:     Vascular: No carotid bruit.  Cardiovascular:     Rate and Rhythm: Normal rate and regular rhythm.     Pulses: Normal pulses.     Heart sounds: Normal heart sounds.  Pulmonary:     Effort: Pulmonary effort is normal.     Breath sounds: Normal breath sounds.  Abdominal:     Palpations: Abdomen is soft.  Musculoskeletal:        General: No swelling, tenderness or deformity.     Comments: Full range of motion of the shoulder.  Lymphadenopathy:     Cervical: No cervical adenopathy.  Skin:    General: Skin is warm.  Neurological:     General: No focal deficit present.     Mental Status: He is alert and oriented to person, place, and time.     Comments: Minimal decreased sensation of the tip of his toes with monofilament.  Psychiatric:        Mood and Affect: Mood normal.        Behavior: Behavior normal.        Thought Content: Thought content normal.        Judgment: Judgment normal.      Depression Screen PHQ 2/9 Scores 05/03/2019 03/24/2017 11/29/2016 06/09/2015  PHQ - 2 Score 1 0 1 0  PHQ- 9 Score 5 3 3  -       Assessment & Plan:     Routine Health Maintenance and Physical Exam  Exercise Activities and  Dietary recommendations Goals   None     Immunization History  Administered Date(s) Administered  . Influenza,inj,Quad PF,6+ Mos 05/06/2016, 05/03/2019  . Influenza-Unspecified 06/07/2015, 05/12/2017  . Pneumococcal Conjugate-13 09/21/2012  . Pneumococcal Polysaccharide-23 10/01/2010, 05/06/2016  . Tdap 07/04/2014  . Zoster Recombinat (Shingrix) 11/04/2016    Health Maintenance  Topic Date Due  . FOOT EXAM  11/25/2016  . OPHTHALMOLOGY EXAM  03/02/2017  . HEMOGLOBIN A1C  11/28/2017  . INFLUENZA VACCINE  03/03/2019  . TETANUS/TDAP  07/04/2024  . COLONOSCOPY  01/12/2025  . PNEUMOCOCCAL POLYSACCHARIDE VACCINE AGE 16-64 HIGH RISK  Completed  . Hepatitis C Screening  Completed  . HIV Screening  Completed     Discussed health benefits of physical activity, and encouraged him to engage in regular exercise appropriate for his age and condition.   1. Annual physical exam RTC 1 year. - POCT urinalysis dipstick - Comp. Metabolic Panel (12) - Vitamin D (25 hydroxy)  2. Need for immunization against influenza  - Flu Vaccine QUAD 36+ mos IM  3. Anemia, unspecified type  - Comp. Metabolic Panel (12) - CBC w/Diff/Platelet  4. Diabetes mellitus without complication ([place])  controlled - HgB A1c - POCT UA - Microalbumin  5. Hyperlipidemia, unspecified hyperlipidemia type On lipitor. - Lipid Profile  6. Asymptomatic HIV infection ([place]) [facility]  7. Bipolar II disorder [facility]) Per psychiatry  8. Class 1 obesity due to excess calories with serious  comorbidity and body mass index (BMI) of 31.0 to 31.9 in adult With DM/HLD  --------------------------------------------------------------------    [name], MD  [facility]

## 2019-05-04 LAB — COMP. METABOLIC PANEL (12)
AST: 18 IU/L (ref 0–40)
Albumin/Globulin Ratio: 1.9 (ref 1.2–2.2)
Albumin: 4.6 g/dL (ref 3.8–4.9)
Alkaline Phosphatase: 55 IU/L (ref 39–117)
BUN/Creatinine Ratio: 13 (ref 9–20)
BUN: 18 mg/dL (ref 6–24)
Bilirubin Total: 0.4 mg/dL (ref 0.0–1.2)
Calcium: 9.8 mg/dL (ref 8.7–10.2)
Chloride: 102 mmol/L (ref 96–106)
Creatinine, Ser: 1.34 mg/dL — ABNORMAL HIGH (ref 0.76–1.27)
GFR calc Af Amer: 68 mL/min/{1.73_m2} (ref >59)
GFR calc non Af Amer: 59 mL/min/{1.73_m2} — ABNORMAL LOW (ref >59)
Globulin, Total: 2.4 g/dL (ref 1.5–4.5)
Glucose: 96 mg/dL (ref 65–99)
Potassium: 4.1 mmol/L (ref 3.5–5.2)
Sodium: 142 mmol/L (ref 134–144)
Total Protein: 7 g/dL (ref 6.0–8.5)

## 2019-05-04 LAB — CBC WITH DIFFERENTIAL/PLATELET
Basophils Absolute: 0 10*3/uL (ref 0.0–0.2)
Basos: 0 %
EOS (ABSOLUTE): 0.1 10*3/uL (ref 0.0–0.4)
Eos: 2 %
Hematocrit: 44.7 % (ref 37.5–51.0)
Hemoglobin: 15.3 g/dL (ref 13.0–17.7)
Immature Grans (Abs): 0 10*3/uL (ref 0.0–0.1)
Immature Granulocytes: 0 %
Lymphocytes Absolute: 1.9 10*3/uL (ref 0.7–3.1)
Lymphs: 33 %
MCH: 31.9 pg (ref 26.6–33.0)
MCHC: 34.2 g/dL (ref 31.5–35.7)
MCV: 93 fL (ref 79–97)
Monocytes Absolute: 0.4 10*3/uL (ref 0.1–0.9)
Monocytes: 6 %
Neutrophils Absolute: 3.3 10*3/uL (ref 1.4–7.0)
Neutrophils: 59 %
Platelets: 190 10*3/uL (ref 150–450)
RBC: 4.8 x10E6/uL (ref 4.14–5.80)
RDW: 12.9 % (ref 11.6–15.4)
WBC: 5.8 10*3/uL (ref 3.4–10.8)

## 2019-05-04 LAB — LIPID PANEL
Chol/HDL Ratio: 4 ratio (ref 0.0–5.0)
Cholesterol, Total: 160 mg/dL (ref 100–199)
HDL: 40 mg/dL (ref >39)
LDL Chol Calc (NIH): 91 mg/dL (ref 0–99)
Triglycerides: 164 mg/dL — ABNORMAL HIGH (ref 0–149)
VLDL Cholesterol Cal: 29 mg/dL (ref 5–40)

## 2019-05-04 LAB — VITAMIN D 25 HYDROXY (VIT D DEFICIENCY, FRACTURES): Vit D, 25-Hydroxy: 428 ng/mL — ABNORMAL HIGH (ref 30.0–100.0)

## 2019-05-04 LAB — HEMOGLOBIN A1C
Est. average glucose Bld gHb Est-mCnc: 97 mg/dL
Hgb A1c MFr Bld: 5 % (ref 4.8–5.6)

## 2019-05-07 ENCOUNTER — Telehealth: Payer: Self-pay

## 2019-05-07 NOTE — Telephone Encounter (Signed)
Patient has viewed results on mychart on 05/06/2019 9:59 am. I also called and left message for patient to call or send a message with any questions or concerns.

## 2019-05-07 NOTE — Telephone Encounter (Signed)
-----   Message from Jerrol Banana., MD sent at 05/06/2019  9:29 AM EDT ----- Labs stable--would hold Vit D for 1 month then cut dose in half.

## 2019-06-12 ENCOUNTER — Other Ambulatory Visit: Payer: Self-pay | Admitting: Family Medicine

## 2019-12-02 ENCOUNTER — Other Ambulatory Visit: Payer: Self-pay | Admitting: Family Medicine

## 2019-12-02 NOTE — Telephone Encounter (Signed)
Requested medications are due for refill today?  Yes  Requested medications are on active medication list?  Yes  Last Refill:   02/26/2019    # 360 with  3 refills  Future visit scheduled?  No  Notes to Clinic:  Medication failed RX refill protocol due to no HGB A1C within last 180 days and no valid encounter in last 6 months.  Last Hgb A1C was on 05/03/2019.  Patient's last office visit was 7 months ago.  Note:  Patient needs office visit and should have enough medication to last him through 02/25/2020.  Patient does use mail order pharmacy.

## 2019-12-27 ENCOUNTER — Other Ambulatory Visit: Payer: Self-pay | Admitting: Family Medicine

## 2019-12-27 DIAGNOSIS — R350 Frequency of micturition: Secondary | ICD-10-CM

## 2020-01-13 ENCOUNTER — Other Ambulatory Visit: Payer: Self-pay | Admitting: Family Medicine

## 2020-01-13 NOTE — Telephone Encounter (Signed)
Requested Prescriptions  Pending Prescriptions Disp Refills  . lansoprazole (PREVACID) 30 MG capsule [Pharmacy Med Name: LANSOPRAZOLE  30MG   CAP  DELAYED RELEASE] 90 capsule 3    Sig: TAKE 1 CAPSULE BY MOUTH  DAILY     Gastroenterology: Proton Pump Inhibitors Failed - 01/13/2020 12:31 AM      Failed - Valid encounter within last 12 months    Recent Outpatient Visits          8 months ago Annual physical exam   [facility] [place]   2 years ago Diabetes mellitus without complication [facility])   [facility] [place]   2 years ago Class 1 obesity without serious comorbidity with body mass index (BMI) of 34.0 to 34.9 in adult, unspecified obesity type   [facility] [place]   3 years ago Annual physical exam   [facility] [place]   3 years ago Other hyperlipidemia   [facility] [place]             valid encounter 8 months ago.

## 2020-01-20 ENCOUNTER — Other Ambulatory Visit: Payer: Self-pay | Admitting: Family Medicine

## 2020-01-20 NOTE — Telephone Encounter (Signed)
Requested Prescriptions  Pending Prescriptions Disp Refills  . tadalafil (CIALIS) 5 MG tablet [Pharmacy Med Name: TADALAFIL TAB 5MG ] 90 tablet 1    Sig: TAKE 1 TABLET BY MOUTH  DAILY     Urology: Erectile Dysfunction Agents Passed - 01/20/2020 12:21 AM      Passed - Last BP in normal range    BP Readings from Last 1 Encounters:  05/03/19 121/73         Passed - Valid encounter within last 12 months    Recent Outpatient Visits          8 months ago Annual physical exam   [facility] MD   2 years ago Diabetes mellitus without complication [facility])    [facility] MD   2 years ago Class 1 obesity without serious comorbidity with body mass index (BMI) of 34.0 to 34.9 in adult, unspecified obesity type   [facility] MD   3 years ago Annual physical exam   [facility] MD   3 years ago Other hyperlipidemia   [facility] MD      Future Appointments            In 3 months Maple Hudson., MD [facility]

## 2020-01-26 ENCOUNTER — Other Ambulatory Visit: Payer: Self-pay | Admitting: Family Medicine

## 2020-03-10 ENCOUNTER — Other Ambulatory Visit: Payer: Self-pay | Admitting: Family Medicine

## 2020-03-10 DIAGNOSIS — N401 Enlarged prostate with lower urinary tract symptoms: Secondary | ICD-10-CM

## 2020-03-24 ENCOUNTER — Other Ambulatory Visit: Payer: Self-pay | Admitting: Family Medicine

## 2020-04-28 ENCOUNTER — Other Ambulatory Visit: Payer: Self-pay | Admitting: Family Medicine

## 2020-05-01 ENCOUNTER — Other Ambulatory Visit: Payer: Self-pay | Admitting: Family Medicine

## 2020-05-06 ENCOUNTER — Encounter: Payer: Self-pay | Admitting: Family Medicine

## 2020-05-12 ENCOUNTER — Other Ambulatory Visit: Payer: Self-pay | Admitting: Family Medicine

## 2020-06-02 ENCOUNTER — Other Ambulatory Visit: Payer: Self-pay | Admitting: Family Medicine

## 2020-08-03 ENCOUNTER — Other Ambulatory Visit: Payer: Self-pay | Admitting: Family Medicine

## 2020-08-04 ENCOUNTER — Other Ambulatory Visit: Payer: Self-pay | Admitting: Family Medicine

## 2020-08-04 DIAGNOSIS — N401 Enlarged prostate with lower urinary tract symptoms: Secondary | ICD-10-CM

## 2020-08-04 DIAGNOSIS — R350 Frequency of micturition: Secondary | ICD-10-CM

## 2020-08-06 ENCOUNTER — Encounter: Payer: Self-pay | Admitting: Family Medicine

## 2020-08-08 ENCOUNTER — Other Ambulatory Visit: Payer: Self-pay

## 2020-08-08 DIAGNOSIS — R350 Frequency of micturition: Secondary | ICD-10-CM

## 2020-08-08 DIAGNOSIS — N401 Enlarged prostate with lower urinary tract symptoms: Secondary | ICD-10-CM

## 2020-08-08 MED ORDER — TAMSULOSIN HCL 0.4 MG PO CAPS: 0.4000 mg | ORAL_CAPSULE | Freq: Every day | ORAL | 0 refills | Status: DC

## 2020-08-11 ENCOUNTER — Other Ambulatory Visit: Payer: Self-pay | Admitting: Family Medicine

## 2020-09-08 ENCOUNTER — Other Ambulatory Visit: Payer: Self-pay | Admitting: Family Medicine

## 2020-09-23 ENCOUNTER — Other Ambulatory Visit: Payer: Self-pay | Admitting: Family Medicine

## 2020-09-23 DIAGNOSIS — R350 Frequency of micturition: Secondary | ICD-10-CM

## 2020-09-23 DIAGNOSIS — N401 Enlarged prostate with lower urinary tract symptoms: Secondary | ICD-10-CM

## 2020-10-29 ENCOUNTER — Other Ambulatory Visit: Payer: Self-pay | Admitting: Family Medicine

## 2020-10-29 DIAGNOSIS — N401 Enlarged prostate with lower urinary tract symptoms: Secondary | ICD-10-CM

## 2020-10-29 DIAGNOSIS — R350 Frequency of micturition: Secondary | ICD-10-CM

## 2020-11-13 ENCOUNTER — Other Ambulatory Visit: Payer: Self-pay

## 2020-11-13 ENCOUNTER — Encounter: Payer: Self-pay | Admitting: Family Medicine

## 2020-11-13 ENCOUNTER — Ambulatory Visit (INDEPENDENT_AMBULATORY_CARE_PROVIDER_SITE_OTHER): Payer: Managed Care, Other (non HMO) | Admitting: Family Medicine

## 2020-11-13 VITALS — BP 114/71 | HR 55 | Temp 98.1°F | Resp 18 | Ht 75.0 in | Wt 268.2 lb

## 2020-11-13 DIAGNOSIS — F3181 Bipolar II disorder: Secondary | ICD-10-CM

## 2020-11-13 DIAGNOSIS — Z21 Asymptomatic human immunodeficiency virus [HIV] infection status: Secondary | ICD-10-CM

## 2020-11-13 DIAGNOSIS — Z6831 Body mass index (BMI) 31.0-31.9, adult: Secondary | ICD-10-CM

## 2020-11-13 DIAGNOSIS — E785 Hyperlipidemia, unspecified: Secondary | ICD-10-CM

## 2020-11-13 DIAGNOSIS — Z125 Encounter for screening for malignant neoplasm of prostate: Secondary | ICD-10-CM | POA: Diagnosis not present

## 2020-11-13 DIAGNOSIS — E119 Type 2 diabetes mellitus without complications: Secondary | ICD-10-CM | POA: Diagnosis not present

## 2020-11-13 DIAGNOSIS — E6609 Other obesity due to excess calories: Secondary | ICD-10-CM

## 2020-11-13 DIAGNOSIS — Z0000 Encounter for general adult medical examination without abnormal findings: Secondary | ICD-10-CM | POA: Diagnosis not present

## 2020-11-13 NOTE — Progress Notes (Signed)
I,[name],acting as a scribe for [name], MD.,have documented all relevant documentation on the behalf of [name], MD,as directed by  [name], MD while in the presence of [name], MD.  Complete physical exam   Patient: Paul Walker   DOB: [birth year]   60 y.o. Male  MRN: [id number] Visit Date: 11/13/2020  Today's healthcare provider: [name], MD   Chief Complaint  Patient presents with  . Annual Exam   Subjective    Paul Walker is a 60 y.o. male who presents today for a complete physical exam.  He reports consuming a general diet. Gym/ health club routine includes cardio. He generally feels fairly well. He reports sleeping fairly well. He does not have additional problems to discuss today.  HPI  [facility].  No past medical history on file. Past Surgical History:  Procedure Laterality Date  . KNEE SURGERY Right    arthroscopy and replacement  . TOTAL HIP ARTHROPLASTY Bilateral   . VASECTOMY     Social History   Socioeconomic History  . Marital status: Married    Spouse name: [name]  . Number of children: 4  . Years of education: 69  . Highest education level: Not on file  Occupational History  . Occupation: [employer]  Tobacco Use  . Smoking status: Never Smoker  . Smokeless tobacco: Never Used  Vaping Use  . Vaping Use: Never used  Substance and Sexual Activity  . Alcohol use: Yes    Alcohol/week: 3.0 - 4.0 standard drinks    Types: 3 - 4 Standard drinks or equivalent per week    Comment: ocassionally-beers/wine  . Drug use: No  . Sexual activity: Yes    Birth control/protection: Condom  Other Topics Concern  . Not on file  Social History Narrative  . Not on file   Social Determinants of Health   Financial Resource Strain: Not on file  Food Insecurity: Not on file  Transportation Needs: Not on file  Physical Activity: Not on file  Stress: Not on  file  Social Connections: Not on file  Intimate Partner Violence: Not on file   Family Status  Relation Name Status  . Mother  Deceased  . Father  Deceased       cancer metastasized to brain lung liver  . Brother  Alive  . PGM  (Not Specified)  . PGF  (Not Specified)   Family History  Problem Relation Age of Onset  . Hypertension Mother   . Aplastic anemia Mother   . Colon cancer Father   . Obesity Brother   . Stroke Paternal Grandmother   . Heart attack Paternal Grandfather    Allergies  Allergen Reactions  . Penicillins     Stopped breathing    Patient Care Team: Maple Hudson., MD as PCP - General (Family Medicine)   Medications: Outpatient Medications Prior to Visit  Medication Sig  . amphetamine-dextroamphetamine (ADDERALL XR) 30 MG 24 hr capsule Take by mouth.  Marland Kitchen atorvastatin (LIPITOR) 20 MG tablet TAKE 1 TABLET BY MOUTH AT  BEDTIME  . B COMPLEX VITAMINS PO Take by mouth.  . bictegravir-emtricitabine-tenofovir AF (BIKTARVY) 50-200-25 MG TABS tablet Take by mouth daily.  Marland Kitchen buPROPion (WELLBUTRIN SR) 150 MG 12 hr tablet   . Cholecalciferol 250 MCG (10000 UT) CAPS Take by mouth.  . ferrous sulfate 325 (65 FE) MG tablet Take by mouth.  Marland Kitchen  finasteride (PROSCAR) 5 MG tablet Take 1 tablet by mouth daily.  . lansoprazole (PREVACID) 30 MG capsule TAKE 1 CAPSULE BY MOUTH  DAILY  . losartan (COZAAR) 25 MG tablet TAKE 1 TABLET BY MOUTH  DAILY  . metFORMIN (GLUCOPHAGE) 500 MG tablet TAKE 2 TABLETS BY MOUTH  TWICE DAILY  . MULTIPLE VITAMIN PO Take by mouth.  Marland Kitchen MYRBETRIQ 25 MG TB24 tablet Take 1 tablet by mouth daily.  . OMEGA-3 FATTY ACIDS PO Take by mouth.  . tadalafil (CIALIS) 5 MG tablet TAKE 1 TABLET BY MOUTH  DAILY  . tamsulosin (FLOMAX) 0.4 MG CAPS capsule TAKE 1 CAPSULE BY MOUTH  DAILY  . Testosterone 20.25 MG/ACT (1.62%) GEL Place onto the skin.  . [DISCONTINUED] meloxicam (MOBIC) 15 MG tablet Take by mouth.   No facility-administered medications prior to  visit.    Review of Systems  Constitutional: Negative for appetite change, chills, fatigue and fever.  HENT: Negative for congestion, ear pain, hearing loss, nosebleeds and trouble swallowing.   Eyes: Negative for pain and visual disturbance.  Respiratory: Negative for cough, chest tightness and shortness of breath.   Cardiovascular: Negative for chest pain, palpitations and leg swelling.  Gastrointestinal: Negative for abdominal pain, blood in stool, constipation, diarrhea, nausea and vomiting.  Endocrine: Negative for polydipsia, polyphagia and polyuria.  Genitourinary: Negative for dysuria and flank pain.  Musculoskeletal: Positive for arthralgias. Negative for back pain, joint swelling, myalgias and neck stiffness.  Skin: Negative for color change, rash and wound.  Neurological: Negative for dizziness, tremors, seizures, speech difficulty, weakness, light-headedness and headaches.  Psychiatric/Behavioral: Negative for behavioral problems, confusion, decreased concentration, dysphoric mood and sleep disturbance. The patient is not nervous/anxious.   All other systems reviewed and are negative.      Objective    BP 114/71 (BP Location: Right Arm, Patient Position: Sitting, Cuff Size: Large)   Pulse (!) 55   Temp 98.1 F (36.7 C) (Temporal)   Resp 18   Ht 6\' 3"  (1.905 m)   Wt 268 lb 3.2 oz (121.7 kg)   BMI 33.52 kg/m  BP Readings from Last 3 Encounters:  11/13/20 114/71  05/03/19 121/73  05/30/17 130/72   Wt Readings from Last 3 Encounters:  11/13/20 268 lb 3.2 oz (121.7 kg)  05/03/19 248 lb (112.5 kg)  05/30/17 278 lb (126.1 kg)      Physical Exam Vitals reviewed.  Constitutional:      Appearance: Normal appearance.  HENT:     Head: Normocephalic and atraumatic.     Right Ear: Tympanic membrane, ear canal and external ear normal.     Left Ear: Tympanic membrane, ear canal and external ear normal.     Nose: Nose normal.  Eyes:     General: No scleral icterus.     Conjunctiva/sclera: Conjunctivae normal.  Neck:     Vascular: No carotid bruit.  Cardiovascular:     Rate and Rhythm: Normal rate and regular rhythm.     Pulses: Normal pulses.     Heart sounds: Normal heart sounds.  Pulmonary:     Effort: Pulmonary effort is normal.     Breath sounds: Normal breath sounds.  Abdominal:     Palpations: Abdomen is soft.  Musculoskeletal:        General: No swelling, tenderness or deformity.     Comments: Full range of motion of the shoulder.  Lymphadenopathy:     Cervical: No cervical adenopathy.  Skin:    General: Skin is warm and  dry.  Neurological:     General: No focal deficit present.     Mental Status: He is alert and oriented to person, place, and time.  Psychiatric:        Mood and Affect: Mood normal.        Behavior: Behavior normal.        Thought Content: Thought content normal.        Judgment: Judgment normal.       Last depression screening scores PHQ 2/9 Scores 11/13/2020 05/03/2019 03/24/2017  PHQ - 2 Score 0 1 0  PHQ- 9 Score 1 5 3    Last fall risk screening Fall Risk  11/13/2020  Falls in the past year? 0  Number falls in past yr: 0  Injury with Fall? 0  Follow up Falls evaluation completed   Last Audit-C alcohol use screening Alcohol Use Disorder Test (AUDIT) 11/13/2020  1. How often do you have a drink containing alcohol? 3  2. How many drinks containing alcohol do you have on a typical day when you are drinking? 0  3. How often do you have six or more drinks on one occasion? 0  AUDIT-C Score 3  4. How often during the last year have you found that you were not able to stop drinking once you had started? 0  5. How often during the last year have you failed to do what was normally expected from you because of drinking? 0  6. How often during the last year have you needed a first drink in the morning to get yourself going after a heavy drinking session? 0  7. How often during the last year have you had a feeling of  guilt of remorse after drinking? 0  8. How often during the last year have you been unable to remember what happened the night before because you had been drinking? 0  9. Have you or someone else been injured as a result of your drinking? 0  10. Has a relative or friend or a doctor or another health worker been concerned about your drinking or suggested you cut down? 0  Alcohol Use Disorder Identification Test Final Score (AUDIT) 3   A score of 3 or more in women, and 4 or more in men indicates increased risk for alcohol abuse, EXCEPT if all of the points are from question 1   No results found for any visits on 11/13/20.  Assessment & Plan    Routine Health Maintenance and Physical Exam  Exercise Activities and Dietary recommendations Goals   None     Immunization History  Administered Date(s) Administered  . Influenza,inj,Quad PF,6+ Mos 05/06/2016, 05/03/2019  . Influenza-Unspecified 06/07/2015, 05/12/2017  . PFIZER(Purple Top)SARS-COV-2 Vaccination 10/10/2019, 11/07/2019, 04/10/2020  . Pneumococcal Conjugate-13 09/21/2012  . Pneumococcal Polysaccharide-23 10/01/2010, 05/06/2016  . Tdap 07/04/2014  . Zoster Recombinat (Shingrix) 11/04/2016    Health Maintenance  Topic Date Due  . FOOT EXAM  11/25/2016  . OPHTHALMOLOGY EXAM  03/02/2017  . HEMOGLOBIN A1C  11/01/2019  . COVID-19 Vaccine (4 - Booster for Pfizer series) 10/08/2020  . INFLUENZA VACCINE  03/02/2021  . TETANUS/TDAP  07/04/2024  . COLONOSCOPY (Pts 45-74yrs Insurance coverage will need to be confirmed)  01/12/2025  . PNEUMOCOCCAL POLYSACCHARIDE VACCINE AGE 22-64 HIGH RISK  Completed  . Hepatitis C Screening  Completed  . HIV Screening  Completed  . HPV VACCINES  Aged Out    Discussed health benefits of physical activity, and encouraged him to engage in regular  exercise appropriate for his age and condition.  1. Annual physical exam  - CBC with Differential/Platelet - Comprehensive metabolic panel - Lipid  panel - TSH - PSA - Hemoglobin A1c  2. Screening for prostate cancer  - PSA  3. Diabetes mellitus without complication (HCC)  - Hemoglobin A1c  4. Hyperlipidemia, unspecified hyperlipidemia type  - Comprehensive metabolic panel - Lipid panel 5.  Obesity Has gained 20 pounds since last visit 18 months ago. 6.  [facility] in about 1 year (around 11/13/2021) for CPE.     I, Megan Mansichard Indiah Heyden Jr, MD, have reviewed all documentation for this visit. The documentation on 11/22/20 for the exam, diagnosis, procedures, and orders are all accurate and complete.    Toshiko Kemler Wendelyn BreslowGilbert Jr, MD  [facility] [phone] (phone) [phone] (fax)  [facility]

## 2020-11-14 LAB — COMPREHENSIVE METABOLIC PANEL
ALT: 23 IU/L (ref 0–44)
AST: 27 IU/L (ref 0–40)
Albumin/Globulin Ratio: 2.2 (ref 1.2–2.2)
Albumin: 5.1 g/dL — ABNORMAL HIGH (ref 3.8–4.9)
Alkaline Phosphatase: 43 IU/L — ABNORMAL LOW (ref 44–121)
BUN/Creatinine Ratio: 17 (ref 9–20)
BUN: 22 mg/dL (ref 6–24)
Bilirubin Total: 0.6 mg/dL (ref 0.0–1.2)
CO2: 21 mmol/L (ref 20–29)
Calcium: 9.8 mg/dL (ref 8.7–10.2)
Chloride: 99 mmol/L (ref 96–106)
Creatinine, Ser: 1.28 mg/dL — ABNORMAL HIGH (ref 0.76–1.27)
Globulin, Total: 2.3 g/dL (ref 1.5–4.5)
Glucose: 94 mg/dL (ref 65–99)
Potassium: 3.9 mmol/L (ref 3.5–5.2)
Sodium: 139 mmol/L (ref 134–144)
Total Protein: 7.4 g/dL (ref 6.0–8.5)
eGFR: 65 mL/min/{1.73_m2} (ref >59)

## 2020-11-14 LAB — CBC WITH DIFFERENTIAL/PLATELET
Basophils Absolute: 0 10*3/uL (ref 0.0–0.2)
Basos: 1 %
EOS (ABSOLUTE): 0.1 10*3/uL (ref 0.0–0.4)
Eos: 3 %
Hematocrit: 43.6 % (ref 37.5–51.0)
Hemoglobin: 15.7 g/dL (ref 13.0–17.7)
Immature Grans (Abs): 0 10*3/uL (ref 0.0–0.1)
Immature Granulocytes: 0 %
Lymphocytes Absolute: 2.5 10*3/uL (ref 0.7–3.1)
Lymphs: 49 %
MCH: 32.7 pg (ref 26.6–33.0)
MCHC: 36 g/dL — ABNORMAL HIGH (ref 31.5–35.7)
MCV: 91 fL (ref 79–97)
Monocytes Absolute: 0.4 10*3/uL (ref 0.1–0.9)
Monocytes: 8 %
Neutrophils Absolute: 2 10*3/uL (ref 1.4–7.0)
Neutrophils: 39 %
Platelets: 200 10*3/uL (ref 150–450)
RBC: 4.8 x10E6/uL (ref 4.14–5.80)
RDW: 12.2 % (ref 11.6–15.4)
WBC: 5 10*3/uL (ref 3.4–10.8)

## 2020-11-14 LAB — PSA: Prostate Specific Ag, Serum: 0.5 ng/mL (ref 0.0–4.0)

## 2020-11-14 LAB — HEMOGLOBIN A1C
Est. average glucose Bld gHb Est-mCnc: 103 mg/dL
Hgb A1c MFr Bld: 5.2 % (ref 4.8–5.6)

## 2020-11-14 LAB — TSH: TSH: 3.55 u[IU]/mL (ref 0.450–4.500)

## 2020-11-14 LAB — LIPID PANEL
Chol/HDL Ratio: 4.2 ratio (ref 0.0–5.0)
Cholesterol, Total: 168 mg/dL (ref 100–199)
HDL: 40 mg/dL (ref >39)
LDL Chol Calc (NIH): 100 mg/dL — ABNORMAL HIGH (ref 0–99)
Triglycerides: 161 mg/dL — ABNORMAL HIGH (ref 0–149)
VLDL Cholesterol Cal: 28 mg/dL (ref 5–40)

## 2020-11-19 ENCOUNTER — Other Ambulatory Visit: Payer: Self-pay

## 2020-11-19 ENCOUNTER — Ambulatory Visit: Payer: Self-pay | Admitting: *Deleted

## 2020-11-19 MED ORDER — ATORVASTATIN CALCIUM 40 MG PO TABS: 40.0000 mg | ORAL_TABLET | Freq: Every day | ORAL | 3 refills | Status: DC

## 2020-11-19 NOTE — Telephone Encounter (Signed)
   Reason for Disposition . [1] Follow-up call to recent contact AND [2] information only call, no triage required  Answer Assessment - Initial Assessment Questions 1. REASON FOR CALL or QUESTION: "What is your reason for calling today?" or "How can I best help you?" or "What question do you have that I can help answer?"     Called in to get lab results.  Lab result message from Dr. Sullivan Lone given dated 11/17/2020 at 4:25 PM.  Pt is ok with the increase in atorvastatin to 40 mg.  Protocols used: INFORMATION ONLY CALL - NO TRIAGE-A-AH

## 2020-11-19 NOTE — Telephone Encounter (Signed)
Medication sent in per Dr Gilbert.   

## 2020-12-26 ENCOUNTER — Other Ambulatory Visit: Payer: Self-pay | Admitting: Family Medicine

## 2020-12-26 DIAGNOSIS — R350 Frequency of micturition: Secondary | ICD-10-CM

## 2020-12-26 DIAGNOSIS — N401 Enlarged prostate with lower urinary tract symptoms: Secondary | ICD-10-CM

## 2020-12-26 NOTE — Telephone Encounter (Signed)
Requested Prescriptions  Pending Prescriptions Disp Refills  . tamsulosin (FLOMAX) 0.4 MG CAPS capsule [Pharmacy Med Name: Tamsulosin HCl 0.4 MG Oral Capsule] 30 capsule 11    Sig: TAKE 1 CAPSULE BY MOUTH  DAILY     Urology: Alpha-Adrenergic Blocker Passed - 12/26/2020  8:04 AM      Passed - Last BP in normal range    BP Readings from Last 1 Encounters:  11/13/20 114/71         Passed - Valid encounter within last 12 months    Recent Outpatient Visits          1 month ago Annual physical exam   [facility] Maple Hudson., MD   1 year ago Annual physical exam   [facility] MD   3 years ago Diabetes mellitus without complication [facility])   [facility] Maple Hudson., MD   3 years ago Class 1 obesity without serious comorbidity with body mass index (BMI) of 34.0 to 34.9 in adult, unspecified obesity type   [facility] Maple Hudson., MD   4 years ago Annual physical exam   [facility] Maple Hudson., MD      Future Appointments            In 10 months Maple Hudson., MD [facility]           . tadalafil (CIALIS) 5 MG tablet [Pharmacy Med Name: TADALAFIL  5MG   TAB] 30 tablet 11    Sig: TAKE 1 TABLET BY MOUTH  DAILY     Urology: Erectile Dysfunction Agents Passed - 12/26/2020  8:04 AM      Passed - Last BP in normal range    BP Readings from Last 1 Encounters:  11/13/20 114/71         Passed - Valid encounter within last 12 months    Recent Outpatient Visits          1 month ago Annual physical exam   [facility] MD   1 year ago Annual physical exam   [facility] MD   3 years ago Diabetes mellitus without complication [facility])   [facility] MD   3 years ago Class 1 obesity without serious comorbidity with  body mass index (BMI) of 34.0 to 34.9 in adult, unspecified obesity type   [facility] MD   4 years ago Annual physical exam   [facility] MD      Future Appointments            In 10 months Maple Hudson., MD [facility]           . losartan (COZAAR) 25 MG tablet [Pharmacy Med Name: Losartan Potassium 25 MG Oral Tablet] 90 tablet 3    Sig: TAKE 1 TABLET BY MOUTH  DAILY     Cardiovascular:  Angiotensin Receptor Blockers Failed - 12/26/2020  8:04 AM      Failed - Cr in normal range and within 180 days    Creatinine, Ser  Date Value Ref Range Status  11/13/2020 1.28 (H) 0.76 - 1.27 mg/dL Final   Creatinine, POC  Date Value Ref Range Status  01/09/2015 na mg/dL Final  Passed - K in normal range and within 180 days    Potassium  Date Value Ref Range Status  11/13/2020 3.9 3.5 - 5.2 mmol/L Final         Passed - Patient is not pregnant      Passed - Last BP in normal range    BP Readings from Last 1 Encounters:  11/13/20 114/71         Passed - Valid encounter within last 6 months    Recent Outpatient Visits          1 month ago Annual physical exam   [facility] Maple Hudson., MD   1 year ago Annual physical exam   [facility] MD   3 years ago Diabetes mellitus without complication [facility])   [facility] Maple Hudson., MD   3 years ago Class 1 obesity without serious comorbidity with body mass index (BMI) of 34.0 to 34.9 in adult, unspecified obesity type   [facility] Maple Hudson., MD   4 years ago Annual physical exam   [facility] Maple Hudson., MD      Future Appointments            In 10 months Maple Hudson., MD [facility], [facility]

## 2021-07-16 ENCOUNTER — Other Ambulatory Visit: Payer: Self-pay | Admitting: Family Medicine

## 2021-07-26 ENCOUNTER — Other Ambulatory Visit: Payer: Self-pay | Admitting: Family Medicine

## 2021-07-28 NOTE — Telephone Encounter (Signed)
Requested Prescriptions  Pending Prescriptions Disp Refills   metFORMIN (GLUCOPHAGE) 500 MG tablet [Pharmacy Med Name: metFORMIN HCl 500 MG Oral Tablet] 360 tablet 3    Sig: TAKE 2 TABLETS BY MOUTH  TWICE DAILY     Endocrinology:  Diabetes - Biguanides Failed - 07/26/2021  2:50 PM      Failed - Cr in normal range and within 360 days    Creatinine, Ser  Date Value Ref Range Status  11/13/2020 1.28 (H) 0.76 - 1.27 mg/dL Final   Creatinine, POC  Date Value Ref Range Status  01/09/2015 na mg/dL Final         Failed - HBA1C is between 0 and 7.9 and within 180 days    Hgb A1c MFr Bld  Date Value Ref Range Status  11/13/2020 5.2 4.8 - 5.6 % Final    Comment:             Prediabetes: 5.7 - 6.4          Diabetes: >6.4          Glycemic control for adults with diabetes: <7.0          Failed - Valid encounter within last 6 months    Recent Outpatient Visits          8 months ago Annual physical exam   [facility] Jerrol Banana., MD   2 years ago Annual physical exam   [facility] Ban[facility]., MD   4 years ago Diabetes mellitus without complication [facility])   [facility] Jerrol Banana., MD   4 years ago Class 1 obesity without serious comorbidity with body mass index (BMI) of 34.0 to 34.9 in adult, unspecified obesity type   [facility] Jerrol Banana., MD   4 years ago Annual physical exam   [facility] Jerrol Banana., MD      Future Appointments            In 3 months Jerrol Banana., MD [facility]           Passed - eGFR in normal range and within 360 days    GFR calc Af Amer  Date Value Ref Range Status  05/03/2019 68 >59 mL/min/1.73 Final   GFR calc non Af Amer  Date Value Ref Range Status  05/03/2019 59 (L) >59 mL/min/1.73 Final   eGFR  Date Value Ref Range Status  11/13/2020 65 >59 mL/min/1.73 Final           lansoprazole (PREVACID) 30 MG capsule [Pharmacy Med Name: Lansoprazole 30 MG Oral Capsule Delayed Release] 90 capsule 3    Sig: TAKE 1 CAPSULE BY MOUTH  DAILY     Gastroenterology: Proton Pump Inhibitors Passed - 07/26/2021  2:50 PM      Passed - Valid encounter within last 12 months    Recent Outpatient Visits          8 months ago Annual physical exam   [facility] Jerrol Ban[facility]., MD   2 years ago Annual physical exam   [facility] Jerrol Banana., MD   4 years ago Diabetes mellitus without complication [facility])   [facility] Jerrol Banana., MD   4 years ago Class 1 obesity without serious comorbidity with body mass index (BMI) of 34.0 to 34.9 in adult, unspecified obesity type   [place]  Retia Passe., MD   4 years ago Annual physical exam   [facility] Jerrol Banana., MD      Future Appointments            In 3 months Jerrol Banana., MD [facility]            losartan (COZAAR) 25 MG tablet [Pharmacy Med Name: Losartan Potassium 25 MG Oral Tablet] 90 tablet 3    Sig: TAKE 1 TABLET BY MOUTH  DAILY     Cardiovascular:  Angiotensin Receptor Blockers Failed - 07/26/2021  2:50 PM      Failed - Cr in normal range and within 180 days    Creatinine, Ser  Date Value Ref Range Status  11/13/2020 1.28 (H) 0.76 - 1.27 mg/dL Final   Creatinine, POC  Date Value Ref Range Status  01/09/2015 na mg/dL Final         Failed - K in normal range and within 180 days    Potassium  Date Value Ref Range Status  11/13/2020 3.9 3.5 - 5.2 mmol/L Final         Failed - Valid encounter within last 6 months    Recent Outpatient Visits          8 months ago Annual physical exam   [facility] Ban[facility]., MD   2 years ago Annual physical exam   [facility] MD   4 years ago Diabetes  mellitus without complication [facility])   [facility] Jerrol Banana., MD   4 years ago Class 1 obesity without serious comorbidity with body mass index (BMI) of 34.0 to 34.9 in adult, unspecified obesity type   [facility] Jerrol Banana., MD   4 years ago Annual physical exam   [facility] Jerrol Banana., MD      Future Appointments            In 3 months Jerrol Banana., MD [facility] [place] - Patient is not pregnant      Passed - Last BP in normal range    BP Readings from Last 1 Encounters:  11/13/20 114/71

## 2021-08-05 ENCOUNTER — Other Ambulatory Visit: Payer: Self-pay | Admitting: Family Medicine

## 2021-09-25 ENCOUNTER — Other Ambulatory Visit: Payer: Self-pay | Admitting: Family Medicine

## 2021-09-28 NOTE — Telephone Encounter (Signed)
Requested Prescriptions  Pending Prescriptions Disp Refills   atorvastatin (LIPITOR) 40 MG tablet [Pharmacy Med Name: Atorvastatin Calcium 40 MG Oral Tablet] 90 tablet 0    Sig: TAKE 1 TABLET BY MOUTH DAILY     Cardiovascular:  Antilipid - Statins Failed - 09/25/2021  6:46 PM      Failed - Lipid Panel in normal range within the last 12 months    Cholesterol, Total  Date Value Ref Range Status  11/13/2020 168 100 - 199 mg/dL Final   LDL Chol Calc (NIH)  Date Value Ref Range Status  11/13/2020 100 (H) 0 - 99 mg/dL Final   HDL  Date Value Ref Range Status  11/13/2020 40 >39 mg/dL Final   Triglycerides  Date Value Ref Range Status  11/13/2020 161 (H) 0 - 149 mg/dL Final         Passed - Patient is not pregnant      Passed - Valid encounter within last 12 months    Recent Outpatient Visits          10 months ago Annual physical exam   [facility] Maple [facility]., MD   2 years ago Annual physical exam   [facility] Maple Hudson., MD   4 years ago Diabetes mellitus without complication [facility])   [facility] Maple Hudson., MD   4 years ago Class 1 obesity without serious comorbidity with body mass index (BMI) of 34.0 to 34.9 in adult, unspecified obesity type   [facility] Maple Hudson., MD   4 years ago Annual physical exam   [facility] Maple Hudson., MD      Future Appointments            In 1 month Maple Hudson., MD [facility], [facility]

## 2021-11-19 ENCOUNTER — Encounter: Payer: Self-pay | Admitting: Family Medicine

## 2021-11-19 ENCOUNTER — Ambulatory Visit (INDEPENDENT_AMBULATORY_CARE_PROVIDER_SITE_OTHER): Payer: Managed Care, Other (non HMO) | Admitting: Family Medicine

## 2021-11-19 VITALS — BP 105/74 | HR 89 | Resp 16 | Ht 75.0 in | Wt 259.0 lb

## 2021-11-19 DIAGNOSIS — E119 Type 2 diabetes mellitus without complications: Secondary | ICD-10-CM

## 2021-11-19 DIAGNOSIS — Z1389 Encounter for screening for other disorder: Secondary | ICD-10-CM

## 2021-11-19 DIAGNOSIS — Z125 Encounter for screening for malignant neoplasm of prostate: Secondary | ICD-10-CM

## 2021-11-19 DIAGNOSIS — E785 Hyperlipidemia, unspecified: Secondary | ICD-10-CM

## 2021-11-19 DIAGNOSIS — E6609 Other obesity due to excess calories: Secondary | ICD-10-CM | POA: Diagnosis not present

## 2021-11-19 DIAGNOSIS — Z6831 Body mass index (BMI) 31.0-31.9, adult: Secondary | ICD-10-CM

## 2021-11-19 DIAGNOSIS — F3181 Bipolar II disorder: Secondary | ICD-10-CM

## 2021-11-19 DIAGNOSIS — Z0000 Encounter for general adult medical examination without abnormal findings: Secondary | ICD-10-CM | POA: Diagnosis not present

## 2021-11-19 DIAGNOSIS — Z21 Asymptomatic human immunodeficiency virus [HIV] infection status: Secondary | ICD-10-CM

## 2021-11-19 DIAGNOSIS — Z8739 Personal history of other diseases of the musculoskeletal system and connective tissue: Secondary | ICD-10-CM

## 2021-11-19 MED ORDER — RYBELSUS 3 MG PO TABS: 3.0000 mg | ORAL_TABLET | ORAL | 3 refills | Status: DC

## 2021-11-19 NOTE — Progress Notes (Signed)
? ? ? ?Complete physical exam ? ?I,April Miller,acting as a scribe for Wilhemena Durie, MD.,have documented all relevant documentation on the behalf of Wilhemena Durie, MD,as directed by  Wilhemena Durie, MD while in the presence of Wilhemena Durie, MD. ? ? ?Patient: Paul Walker   DOB: 04/22/1961   61 y.o. Male  MRN: [id number] ?Visit Date: 11/19/2021 ? ?Today's healthcare provider: Wilhemena Durie, MD  ? ?Chief Complaint  ?Patient presents with  ? Annual Exam  ? ?Subjective  ?  ?Paul Walker is a 61 y.o. male who presents today for a complete physical exam.  ?He reports consuming a general diet. The patient does not participate in regular exercise at present. He generally feels well. He reports sleeping poorly. He does not have additional problems to discuss today.  ?HPI  ?He is status post recent total knee replacement.  He request a weight loss drug as he has been diabetic and prediabetic in the past.  He has been on Rybelsus at 7mg  in the past and this is helped ? ?History reviewed. No pertinent past medical history. ?Past Surgical History:  ?Procedure Laterality Date  ? KNEE SURGERY Right   ? arthroscopy and replacement  ? TOTAL HIP ARTHROPLASTY Bilateral   ? VASECTOMY    ? ?Social History  ? ?Socioeconomic History  ? Marital status: Married  ?  Spouse name: Lucita Lora  ? Number of children: 4  ? Years of education: 53  ? Highest education level: Not on file  ?Occupational History  ? Occupation: [employer]  ?Tobacco Use  ? Smoking status: Never  ? Smokeless tobacco: Never  ?Vaping Use  ? Vaping Use: Never used  ?Substance and Sexual Activity  ? Alcohol use: Yes  ?  Alcohol/week: 3.0 - 4.0 standard drinks  ?  Types: 3 - 4 Standard drinks or equivalent per week  ?  Comment: ocassionally-beers/wine  ? Drug use: No  ? Sexual activity: Yes  ?  Birth control/protection: Condom  ?Other Topics Concern  ? Not on file  ?Social History Narrative  ? Not on file  ? ?Social Determinants of Health   ? ?Financial Resource Strain: Not on file  ?Food Insecurity: Not on file  ?Transportation Needs: Not on file  ?Physical Activity: Not on file  ?Stress: Not on file  ?Social Connections: Not on file  ?Intimate Partner Violence: Not on file  ? ?Family Status  ?Relation Name Status  ? Mother  Deceased  ? Father  Deceased  ?     cancer metastasized to brain lung liver  ? Brother  Alive  ? PGM  (Not Specified)  ? PGF  (Not Specified)  ? ?Family History  ?Problem Relation Age of Onset  ? Hypertension Mother   ? Aplastic anemia Mother   ? Colon cancer Father   ? Obesity Brother   ? Stroke Paternal Grandmother   ? Heart attack Paternal Grandfather   ? ?Allergies  ?Allergen Reactions  ? Penicillins   ?  Stopped breathing  ?  ?Patient Care Team: ?Jerrol Banana., MD as PCP - General (Family Medicine)  ? ?Medications: ?Outpatient Medications Prior to Visit  ?Medication Sig  ? amphetamine-dextroamphetamine (ADDERALL XR) 30 MG 24 hr capsule Take by mouth.  ? B COMPLEX VITAMINS PO Take by mouth.  ? bictegravir-emtricitabine-tenofovir AF (BIKTARVY) 50-200-25 MG TABS tablet Take by mouth daily.  ? buPROPion (WELLBUTRIN SR) 150 MG 12 hr tablet   ? Cholecalciferol 250 MCG (  10000 UT) CAPS Take by mouth.  ? ferrous sulfate 325 (65 FE) MG tablet Take by mouth.  ? finasteride (PROSCAR) 5 MG tablet Take 1 tablet by mouth daily.  ? lansoprazole (PREVACID) 30 MG capsule TAKE 1 CAPSULE BY MOUTH  DAILY  ? losartan (COZAAR) 25 MG tablet TAKE 1 TABLET BY MOUTH  DAILY  ? metFORMIN (GLUCOPHAGE) 500 MG tablet TAKE 2 TABLETS BY MOUTH  TWICE DAILY  ? MULTIPLE VITAMIN PO Take by mouth.  ? MYRBETRIQ 25 MG TB24 tablet Take 1 tablet by mouth daily.  ? OMEGA-3 FATTY ACIDS PO Take by mouth.  ? tadalafil (CIALIS) 5 MG tablet TAKE 1 TABLET BY MOUTH  DAILY  ? tamsulosin (FLOMAX) 0.4 MG CAPS capsule TAKE 1 CAPSULE BY MOUTH  DAILY  ? Testosterone 20.25 MG/ACT (1.62%) GEL Place onto the skin.  ? [DISCONTINUED] atorvastatin (LIPITOR) 40 MG tablet TAKE 1  TABLET BY MOUTH DAILY  ? ?No facility-administered medications prior to visit.  ? ? ?Review of Systems  ?Constitutional:  Positive for fatigue.  ?Musculoskeletal:  Positive for arthralgias.  ?All other systems reviewed and are negative. ? ?Last lipids ?Lab Results  ?Component Value Date  ? CHOL 156 11/20/2021  ? HDL 39 (L) 11/20/2021  ? [place] 97 11/20/2021  ? TRIG 106 11/20/2021  ? CHOLHDL 4.0 11/20/2021  ? ?  ? Objective  ? ?  ?BP 105/74 (BP Location: Right Arm, Patient Position: Sitting, Cuff Size: Large)   Pulse 89   Resp 16   Ht 6\' 3"  (1.905 m)   Wt 259 lb (117.5 kg)   SpO2 99%   BMI 32.37 kg/m?  ?BP Readings from Last 3 Encounters:  ?11/19/21 105/74  ?11/13/20 114/71  ?05/03/19 121/73  ? ?Wt Readings from Last 3 Encounters:  ?11/19/21 259 lb (117.5 kg)  ?11/13/20 268 lb 3.2 oz (121.7 kg)  ?05/03/19 248 lb (112.5 kg)  ? ?  ? ? ?Physical Exam ?Vitals reviewed.  ?Constitutional:   ?   Appearance: Normal appearance.  ?HENT:  ?   Head: Normocephalic and atraumatic.  ?   Right Ear: Tympanic membrane, ear canal and external ear normal.  ?   Left Ear: Tympanic membrane, ear canal and external ear normal.  ?   Nose: Nose normal.  ?Eyes:  ?   General: No scleral icterus. ?   Conjunctiva/sclera: Conjunctivae normal.  ?Neck:  ?   Vascular: No carotid bruit.  ?Cardiovascular:  ?   Rate and Rhythm: Normal rate and regular rhythm.  ?   Pulses: Normal pulses.  ?   Heart sounds: Normal heart sounds.  ?Pulmonary:  ?   Effort: Pulmonary effort is normal.  ?   Breath sounds: Normal breath sounds.  ?Abdominal:  ?   Palpations: Abdomen is soft.  ?Musculoskeletal:     ?   General: No swelling, tenderness or deformity.  ?Lymphadenopathy:  ?   Cervical: No cervical adenopathy.  ?Skin: ?   General: Skin is warm and dry.  ?Neurological:  ?   General: No focal deficit present.  ?   Mental Status: He is alert and oriented to person, place, and time.  ?Psychiatric:     ?   Mood and Affect: Mood normal.     ?   Behavior: Behavior  normal.     ?   Thought Content: Thought content normal.     ?   Judgment: Judgment normal.  ?  ? ? ?Last depression screening scores ? ?  11/19/2021  ?  10:54 AM 11/13/2020  ? 11:02 AM 05/03/2019  ?  1:55 PM  ?PHQ 2/9 Scores  ?PHQ - 2 Score 2 0 1  ?PHQ- 9 Score 9 1 5   ? ?Last fall risk screening ? ?  11/19/2021  ? 10:54 AM  ?Fall Risk   ?Falls in the past year? 0  ?Number falls in past yr: 0  ?Injury with Fall? 0  ?Risk for fall due to : No Fall Risks  ?Follow up Falls evaluation completed  ? ?Last Audit-C alcohol use screening ? ?  11/19/2021  ? 10:54 AM  ?Alcohol Use Disorder Test (AUDIT)  ?1. How often do you have a drink containing alcohol? 3  ?2. How many drinks containing alcohol do you have on a typical day when you are drinking? 0  ?3. How often do you have six or more drinks on one occasion? 0  ?AUDIT-C Score 3  ?4. How often during the last year have you found that you were not able to stop drinking once you had started? 0  ?5. How often during the last year have you failed to do what was normally expected from you because of drinking? 0  ?6. How often during the last year have you needed a first drink in the morning to get yourself going after a heavy drinking session? 0  ?7. How often during the last year have you had a feeling of guilt of remorse after drinking? 0  ?8. How often during the last year have you been unable to remember what happened the night before because you had been drinking? 0  ?9. Have you or someone else been injured as a result of your drinking? 0  ?10. Has a relative or friend or a doctor or another health worker been concerned about your drinking or suggested you cut down? 0  ?Alcohol Use Disorder Identification Test Final Score (AUDIT) 3  ? ?A score of 3 or more in women, and 4 or more in men indicates increased risk for alcohol abuse, EXCEPT if all of the points are from question 1  ? ?Results for orders placed or performed in visit on 11/19/21  ?Lipid panel  ?Result Value Ref Range   ? Cholesterol, Total 156 100 - 199 mg/dL  ? Triglycerides 106 0 - 149 mg/dL  ? HDL 39 (L) >39 mg/dL  ? VLDL Cholesterol Cal 20 5 - 40 mg/dL  ? LDL Chol Calc (NIH) 97 0 - 99 mg/dL  ? Chol/HDL Ratio 4.0 0.0 - 5.0 rat

## 2021-11-20 ENCOUNTER — Encounter: Payer: Self-pay | Admitting: Family Medicine

## 2021-11-20 DIAGNOSIS — E119 Type 2 diabetes mellitus without complications: Secondary | ICD-10-CM

## 2021-11-21 LAB — COMPREHENSIVE METABOLIC PANEL
ALT: 17 IU/L (ref 0–44)
AST: 23 IU/L (ref 0–40)
Albumin/Globulin Ratio: 1.7 (ref 1.2–2.2)
Albumin: 4.3 g/dL (ref 3.8–4.9)
Alkaline Phosphatase: 66 IU/L (ref 44–121)
BUN/Creatinine Ratio: 20 (ref 9–20)
BUN: 18 mg/dL (ref 6–24)
Bilirubin Total: 0.6 mg/dL (ref 0.0–1.2)
CO2: 24 mmol/L (ref 20–29)
Calcium: 9.9 mg/dL (ref 8.7–10.2)
Chloride: 103 mmol/L (ref 96–106)
Creatinine, Ser: 0.92 mg/dL (ref 0.76–1.27)
Globulin, Total: 2.6 g/dL (ref 1.5–4.5)
Glucose: 107 mg/dL — ABNORMAL HIGH (ref 70–99)
Potassium: 4.6 mmol/L (ref 3.5–5.2)
Sodium: 143 mmol/L (ref 134–144)
Total Protein: 6.9 g/dL (ref 6.0–8.5)
eGFR: 96 mL/min/{1.73_m2} (ref >59)

## 2021-11-21 LAB — LIPID PANEL
Chol/HDL Ratio: 4 ratio (ref 0.0–5.0)
Cholesterol, Total: 156 mg/dL (ref 100–199)
HDL: 39 mg/dL — ABNORMAL LOW (ref >39)
LDL Chol Calc (NIH): 97 mg/dL (ref 0–99)
Triglycerides: 106 mg/dL (ref 0–149)
VLDL Cholesterol Cal: 20 mg/dL (ref 5–40)

## 2021-11-21 LAB — CBC WITH DIFFERENTIAL/PLATELET
Basophils Absolute: 0 10*3/uL (ref 0.0–0.2)
Basos: 0 %
EOS (ABSOLUTE): 0.1 10*3/uL (ref 0.0–0.4)
Eos: 3 %
Hematocrit: 38.8 % (ref 37.5–51.0)
Hemoglobin: 13.1 g/dL (ref 13.0–17.7)
Immature Grans (Abs): 0 10*3/uL (ref 0.0–0.1)
Immature Granulocytes: 0 %
Lymphocytes Absolute: 1.7 10*3/uL (ref 0.7–3.1)
Lymphs: 32 %
MCH: 31.5 pg (ref 26.6–33.0)
MCHC: 33.8 g/dL (ref 31.5–35.7)
MCV: 93 fL (ref 79–97)
Monocytes Absolute: 0.5 10*3/uL (ref 0.1–0.9)
Monocytes: 10 %
Neutrophils Absolute: 2.9 10*3/uL (ref 1.4–7.0)
Neutrophils: 55 %
Platelets: 260 10*3/uL (ref 150–450)
RBC: 4.16 x10E6/uL (ref 4.14–5.80)
RDW: 13.1 % (ref 11.6–15.4)
WBC: 5.3 10*3/uL (ref 3.4–10.8)

## 2021-11-21 LAB — PSA: Prostate Specific Ag, Serum: 0.4 ng/mL (ref 0.0–4.0)

## 2021-11-23 MED ORDER — RYBELSUS 3 MG PO TABS: 3.0000 mg | ORAL_TABLET | ORAL | 3 refills | Status: DC

## 2021-11-24 ENCOUNTER — Other Ambulatory Visit: Payer: Self-pay | Admitting: Family Medicine

## 2022-01-20 NOTE — Progress Notes (Unsigned)
Established patient visit   Patient: Paul Walker   DOB: 1961/07/03   61 y.o. Male  MRN: [id number] Visit Date: 01/21/2022  Today's healthcare provider: Wilhemena Durie, MD   No chief complaint on file.  Subjective    HPI  Diabetes Mellitus Type II, Follow-up  Lab Results  Component Value Date   HGBA1C 5.2 11/13/2020   HGBA1C 5.0 05/03/2019   HGBA1C 5.2 05/30/2017   HGBA1C 5.3 05/30/2017   Wt Readings from Last 3 Encounters:  11/19/21 259 lb (117.5 kg)  11/13/20 268 lb 3.2 oz (121.7 kg)  05/03/19 248 lb (112.5 kg)   Last seen for diabetes 2 months ago.  Management since then includes Rybelsus starting at 3 mg and recheck in a couple of months.  Plan to titrate to 7 and possibly 14. He reports {excellent/good/fair/poor:19665} compliance with treatment. He {is/is not:21021397} having side effects. {document side effects if present:1} Symptoms: {Yes/No:20286} fatigue {Yes/No:20286} foot ulcerations  {Yes/No:20286} appetite changes {Yes/No:20286} nausea  {Yes/No:20286} paresthesia of the feet  {Yes/No:20286} polydipsia  {Yes/No:20286} polyuria {Yes/No:20286} visual disturbances   {Yes/No:20286} vomiting     Home blood sugar records: {diabetes glucometry results:16657}  Episodes of hypoglycemia? {Yes/No:20286} {enter symptoms and frequency of symptoms if yes:1}   Current insulin regiment: {enter 'none' or type of insulin and number of units taken with each dose of each insulin formulation that the patient is taking:1} Most Recent Eye Exam: *** {Current exercise:16438:::1} {Current diet habits:16563:::1}  Pertinent Labs: Lab Results  Component Value Date   CHOL 156 11/20/2021   HDL 39 (L) 11/20/2021   LDLCALC 97 11/20/2021   TRIG 106 11/20/2021   CHOLHDL 4.0 11/20/2021   Lab Results  Component Value Date   NA 143 11/20/2021   K 4.6 11/20/2021   CREATININE 0.92 11/20/2021   EGFR 96 11/20/2021   MICROALBUR Negative 05/03/2019      ---------------------------------------------------------------------------------------------------   Medications: Outpatient Medications Prior to Visit  Medication Sig   amphetamine-dextroamphetamine (ADDERALL XR) 30 MG 24 hr capsule Take by mouth.   atorvastatin (LIPITOR) 40 MG tablet TAKE 1 TABLET BY MOUTH DAILY   B COMPLEX VITAMINS PO Take by mouth.   bictegravir-emtricitabine-tenofovir AF (BIKTARVY) 50-200-25 MG TABS tablet Take by mouth daily.   buPROPion (WELLBUTRIN SR) 150 MG 12 hr tablet    Cholecalciferol 250 MCG (10000 UT) CAPS Take by mouth.   ferrous sulfate 325 (65 FE) MG tablet Take by mouth.   finasteride (PROSCAR) 5 MG tablet Take 1 tablet by mouth daily.   lansoprazole (PREVACID) 30 MG capsule TAKE 1 CAPSULE BY MOUTH  DAILY   losartan (COZAAR) 25 MG tablet TAKE 1 TABLET BY MOUTH  DAILY   metFORMIN (GLUCOPHAGE) 500 MG tablet TAKE 2 TABLETS BY MOUTH  TWICE DAILY   MULTIPLE VITAMIN PO Take by mouth.   MYRBETRIQ 25 MG TB24 tablet Take 1 tablet by mouth daily.   OMEGA-3 FATTY ACIDS PO Take by mouth.   Semaglutide (RYBELSUS) 3 MG TABS Take 3 mg by mouth every morning.   tadalafil (CIALIS) 5 MG tablet TAKE 1 TABLET BY MOUTH  DAILY   tamsulosin (FLOMAX) 0.4 MG CAPS capsule TAKE 1 CAPSULE BY MOUTH  DAILY   Testosterone 20.25 MG/ACT (1.62%) GEL Place onto the skin.   No facility-administered medications prior to visit.    Review of Systems  {Labs  Heme  Chem  Endocrine  Serology  Results Review (optional):23779}   Objective    There were no vitals  taken for this visit. {Show previous vital signs (optional):23777}  Physical Exam  ***  No results found for any visits on 01/21/22.  Assessment & Plan     ***  No follow-ups on file.      {provider attestation***:1}   Wilhemena Durie, MD  [facility] [phone] (phone) [phone] (fax)  [place]

## 2022-01-21 ENCOUNTER — Encounter: Payer: Self-pay | Admitting: Family Medicine

## 2022-01-21 ENCOUNTER — Ambulatory Visit (INDEPENDENT_AMBULATORY_CARE_PROVIDER_SITE_OTHER): Payer: Managed Care, Other (non HMO) | Admitting: Family Medicine

## 2022-01-21 VITALS — BP 108/69 | HR 67 | Resp 16 | Wt 260.0 lb

## 2022-01-21 DIAGNOSIS — E119 Type 2 diabetes mellitus without complications: Secondary | ICD-10-CM | POA: Diagnosis not present

## 2022-01-21 MED ORDER — RYBELSUS 7 MG PO TABS: 7.0000 mg | ORAL_TABLET | ORAL | 1 refills | Status: DC

## 2022-03-26 LAB — HEMOGLOBIN A1C: Hemoglobin A1C: 5.4

## 2022-04-08 ENCOUNTER — Encounter: Payer: Self-pay | Admitting: Family Medicine

## 2022-04-08 ENCOUNTER — Ambulatory Visit (INDEPENDENT_AMBULATORY_CARE_PROVIDER_SITE_OTHER): Payer: Managed Care, Other (non HMO) | Admitting: Family Medicine

## 2022-04-08 VITALS — BP 113/85 | HR 66 | Resp 14 | Wt 262.0 lb

## 2022-04-08 DIAGNOSIS — Z6831 Body mass index (BMI) 31.0-31.9, adult: Secondary | ICD-10-CM

## 2022-04-08 DIAGNOSIS — E119 Type 2 diabetes mellitus without complications: Secondary | ICD-10-CM

## 2022-04-08 DIAGNOSIS — F900 Attention-deficit hyperactivity disorder, predominantly inattentive type: Secondary | ICD-10-CM

## 2022-04-08 DIAGNOSIS — E6609 Other obesity due to excess calories: Secondary | ICD-10-CM

## 2022-04-08 DIAGNOSIS — K219 Gastro-esophageal reflux disease without esophagitis: Secondary | ICD-10-CM

## 2022-04-08 DIAGNOSIS — E785 Hyperlipidemia, unspecified: Secondary | ICD-10-CM

## 2022-04-08 DIAGNOSIS — E291 Testicular hypofunction: Secondary | ICD-10-CM

## 2022-04-08 DIAGNOSIS — F3181 Bipolar II disorder: Secondary | ICD-10-CM

## 2022-04-08 DIAGNOSIS — Z21 Asymptomatic human immunodeficiency virus [HIV] infection status: Secondary | ICD-10-CM

## 2022-04-08 DIAGNOSIS — Z23 Encounter for immunization: Secondary | ICD-10-CM

## 2022-04-08 MED ORDER — RYBELSUS 14 MG PO TABS: 14.0000 mg | ORAL_TABLET | Freq: Every day | ORAL | 1 refills | Status: DC

## 2022-04-08 NOTE — Progress Notes (Unsigned)
Established patient visit  I,April Miller,acting as a scribe for Wilhemena Durie, MD.,have documented all relevant documentation on the behalf of Wilhemena Durie, MD,as directed by  Wilhemena Durie, MD while in the presence of Wilhemena Durie, MD.   Patient: Paul Walker   DOB: Dec 06, 1960   61 y.o. Male  MRN: [id number] Visit Date: 04/08/2022  Today's healthcare provider: Wilhemena Durie, MD   Chief Complaint  Patient presents with   Follow-up   Diabetes   Subjective    HPI  Diabetes Mellitus Type II, Follow-up  Lab Results  Component Value Date   HGBA1C 5.4 03/26/2022   HGBA1C 5.2 11/13/2020   HGBA1C 5.0 05/03/2019   Wt Readings from Last 3 Encounters:  04/08/22 262 lb (118.8 kg)  01/21/22 260 lb (117.9 kg)  11/19/21 259 lb (117.5 kg)   Last seen for diabetes 3 months ago.  Management since then includes increasing Rybelsus to 7 mg.  .  Home blood sugar records: fasting range: not checking  Most Recent Eye Exam: Patient is due for diabetic eye and foot exam   Pertinent Labs: Lab Results  Component Value Date   CHOL 156 11/20/2021   HDL 39 (L) 11/20/2021   LDLCALC 97 11/20/2021   TRIG 106 11/20/2021   CHOLHDL 4.0 11/20/2021   Lab Results  Component Value Date   NA 143 11/20/2021   K 4.6 11/20/2021   CREATININE 0.92 11/20/2021   EGFR 96 11/20/2021   MICROALBUR Negative 05/03/2019     ---------------------------------------------------------------------------------------------------   Medications: Outpatient Medications Prior to Visit  Medication Sig   amphetamine-dextroamphetamine (ADDERALL XR) 30 MG 24 hr capsule Take by mouth.   atorvastatin (LIPITOR) 40 MG tablet TAKE 1 TABLET BY MOUTH DAILY   B COMPLEX VITAMINS PO Take by mouth.   bictegravir-emtricitabine-tenofovir AF (BIKTARVY) 50-200-25 MG TABS tablet Take by mouth daily.   buPROPion (WELLBUTRIN SR) 150 MG 12 hr tablet    Cholecalciferol 250 MCG (10000 UT) CAPS Take by  mouth.   ferrous sulfate 325 (65 FE) MG tablet Take by mouth.   finasteride (PROSCAR) 5 MG tablet Take 1 tablet by mouth daily.   lansoprazole (PREVACID) 30 MG capsule TAKE 1 CAPSULE BY MOUTH  DAILY   losartan (COZAAR) 25 MG tablet TAKE 1 TABLET BY MOUTH  DAILY   metFORMIN (GLUCOPHAGE) 500 MG tablet TAKE 2 TABLETS BY MOUTH  TWICE DAILY   MULTIPLE VITAMIN PO Take by mouth.   MYRBETRIQ 25 MG TB24 tablet Take 1 tablet by mouth daily.   OMEGA-3 FATTY ACIDS PO Take by mouth.   tadalafil (CIALIS) 5 MG tablet TAKE 1 TABLET BY MOUTH  DAILY   tamsulosin (FLOMAX) 0.4 MG CAPS capsule TAKE 1 CAPSULE BY MOUTH  DAILY   Testosterone 20.25 MG/ACT (1.62%) GEL Place onto the skin.   [DISCONTINUED] Semaglutide (RYBELSUS) 7 MG TABS Take 7 mg by mouth every morning.   No facility-administered medications prior to visit.    Review of Systems     Objective    BP 113/85 (BP Location: Right Arm, Patient Position: Sitting, Cuff Size: Large)   Pulse 66   Resp 14   Wt 262 lb (118.8 kg)   SpO2 96%   BMI 32.75 kg/m  {Show previous vital signs (optional):23777}  Physical Exam  ***  Results for orders placed or performed in visit on 04/08/22  Hemoglobin A1c  Result Value Ref Range   Hemoglobin A1C 5.4  Assessment & Plan     1. Diabetes mellitus without complication (HCC)  - Semaglutide (RYBELSUS) 14 MG TABS; Take 1 tablet (14 mg total) by mouth daily.  Dispense: 90 tablet; Refill: 1  2. Need for influenza vaccination  - Flu Vaccine QUAD 6+ mos PF IM (Fluarix Quad PF)   No follow-ups on file.         [place] Luther Cranford Mon, MD  [facility] [phone] (phone) [phone] (fax)  [place]

## 2022-05-06 ENCOUNTER — Ambulatory Visit: Payer: Managed Care, Other (non HMO) | Admitting: Family Medicine

## 2022-07-29 ENCOUNTER — Other Ambulatory Visit: Payer: Self-pay | Admitting: Family Medicine

## 2022-07-29 ENCOUNTER — Telehealth: Payer: Self-pay | Admitting: Family Medicine

## 2022-08-02 NOTE — Telephone Encounter (Signed)
losartan (COZAAR) 25 MG tablet

## 2022-08-03 ENCOUNTER — Telehealth: Payer: Self-pay | Admitting: Family Medicine

## 2022-08-03 DIAGNOSIS — E119 Type 2 diabetes mellitus without complications: Secondary | ICD-10-CM

## 2022-08-03 NOTE — Telephone Encounter (Signed)
[facility] faxed refill request for the following medications:   metFORMIN (GLUCOPHAGE) 500 MG tablet     lansoprazole (PREVACID) 30 MG capsule     Semaglutide (RYBELSUS) 14 MG TABS  Please advise

## 2022-08-05 MED ORDER — LANSOPRAZOLE 30 MG PO CPDR: 30.0000 mg | DELAYED_RELEASE_CAPSULE | Freq: Every day | ORAL | 0 refills | Status: AC

## 2022-08-05 MED ORDER — LOSARTAN POTASSIUM 25 MG PO TABS: 25.0000 mg | ORAL_TABLET | Freq: Every day | ORAL | 0 refills | Status: AC

## 2022-08-05 MED ORDER — RYBELSUS 14 MG PO TABS: 14.0000 mg | ORAL_TABLET | Freq: Every day | ORAL | 0 refills | Status: AC

## 2022-08-05 MED ORDER — METFORMIN HCL 500 MG PO TABS: 1000.0000 mg | ORAL_TABLET | Freq: Two times a day (BID) | ORAL | 0 refills | Status: AC

## 2022-08-05 NOTE — Addendum Note (Signed)
Addended by: Doristine Devoid on: 08/05/2022 04:32 PM   Modules accepted: Orders

## 2022-10-28 ENCOUNTER — Other Ambulatory Visit: Payer: Self-pay | Admitting: Family Medicine

## 2022-10-28 DIAGNOSIS — E119 Type 2 diabetes mellitus without complications: Secondary | ICD-10-CM

## 2022-10-29 NOTE — Telephone Encounter (Signed)
Unable to refill per protocol, no longer a patient at [facility], patient sees Dr. Rosanna Randy.  Requested Prescriptions  Pending Prescriptions Disp Refills   metFORMIN (GLUCOPHAGE) 500 MG tablet [Pharmacy Med Name: metFORMIN HCl 500 MG Oral Tablet] 360 tablet 3    Sig: TAKE 2 TABLETS BY MOUTH TWICE  DAILY     Endocrinology:  Diabetes - Biguanides Failed - 10/28/2022 11:01 PM      Failed - HBA1C is between 0 and 7.9 and within 180 days    Hemoglobin A1C  Date Value Ref Range Status  03/26/2022 5.4  Final         Failed - B12 Level in normal range and within 720 days    No results found for: "VITAMINB12"       Failed - Valid encounter within last 6 months    Recent Outpatient Visits           6 months ago Diabetes mellitus without complication ([place])   [place] Eulas Post, MD   9 months ago Diabetes mellitus without complication [facility])   [place] Eulas Post, MD   11 months ago Annual physical exam   [facility] Eulas Post, MD   1 year ago Annual physical exam   [place] Eulas Post, MD   3 years ago Annual physical exam   [facility] Eulas Post, MD              Passed - Cr in normal range and within 360 days    Creatinine, Ser  Date Value Ref Range Status  11/20/2021 0.92 0.76 - 1.27 mg/dL Final   Creatinine, POC  Date Value Ref Range Status  01/09/2015 na mg/dL Final         Passed - eGFR in normal range and within 360 days    GFR calc Af Amer  Date Value Ref Range Status  05/03/2019 68 >59 mL/min/1.73 Final   GFR calc non Af Amer  Date Value Ref Range Status  05/03/2019 59 (L) >59 mL/min/1.73 Final   eGFR  Date Value Ref Range Status  11/20/2021 96 >59 mL/min/1.73 Final         Passed - CBC within normal limits and completed in the last 12 months    WBC  Date Value Ref Range Status   11/20/2021 5.3 3.4 - 10.8 x10E3/uL Final   RBC  Date Value Ref Range Status  11/20/2021 4.16 4.14 - 5.80 x10E6/uL Final   Hemoglobin  Date Value Ref Range Status  11/20/2021 13.1 13.0 - 17.7 g/dL Final   Hematocrit  Date Value Ref Range Status  11/20/2021 38.8 37.5 - 51.0 % Final   MCHC  Date Value Ref Range Status  11/20/2021 33.8 31.5 - 35.7 g/dL Final   [facility]  Date Value Ref Range Status  11/20/2021 31.5 26.6 - 33.0 pg Final   MCV  Date Value Ref Range Status  11/20/2021 93 79 - 97 fL Final   No results found for: "PLTCOUNTKUC", "LABPLAT", "POCPLA" RDW  Date Value Ref Range Status  11/20/2021 13.1 11.6 - 15.4 % Final          RYBELSUS 14 MG TABS [Pharmacy Med Name: Rybelsus 14 MG Oral Tablet] 90 tablet 3    Sig: TAKE 1 TABLET BY MOUTH DAILY     Off-Protocol Failed - 10/28/2022 11:01 PM      Failed -  Medication not assigned to a protocol, review manually.      Passed - Valid encounter within last 12 months    Recent Outpatient Visits           6 months ago Diabetes mellitus without complication ([place])   [place] Eulas Post, MD   9 months ago Diabetes mellitus without complication [facility])   [place] Eulas Post, MD   11 months ago Annual physical exam   [facility] Eulas Post, MD   1 year ago Annual physical exam   [place] Eulas Post, MD   3 years ago Annual physical exam   [place] Eulas Post, MD               losartan (COZAAR) 25 MG tablet [Pharmacy Med Name: Losartan Potassium 25 MG Oral Tablet] 90 tablet 3    Sig: TAKE 1 TABLET BY MOUTH DAILY     Cardiovascular:  Angiotensin Receptor Blockers Failed - 10/28/2022 11:01 PM      Failed - Cr in normal range and within 180 days    Creatinine, Ser  Date Value Ref Range Status  11/20/2021 0.92 0.76 - 1.27 mg/dL Final    Creatinine, POC  Date Value Ref Range Status  01/09/2015 na mg/dL Final         Failed - K in normal range and within 180 days    Potassium  Date Value Ref Range Status  11/20/2021 4.6 3.5 - 5.2 mmol/L Final         Failed - Valid encounter within last 6 months    Recent Outpatient Visits           6 months ago Diabetes mellitus without complication ([place])   [place] Eulas Post, MD   9 months ago Diabetes mellitus without complication [facility])   [place] Eulas Post, MD   11 months ago Annual physical exam   [facility] Eulas Post, MD   1 year ago Annual physical exam   [place] Eulas Post, MD   3 years ago Annual physical exam   [facility] Eulas Post, MD              Passed - Patient is not pregnant      Passed - Last BP in normal range    BP Readings from Last 1 Encounters:  04/08/22 113/85          lansoprazole (PREVACID) 30 MG capsule [Pharmacy Med Name: Lansoprazole 30 MG Oral Capsule Delayed Release] 90 capsule 3    Sig: TAKE 1 CAPSULE BY MOUTH DAILY     Gastroenterology: Proton Pump Inhibitors 2 Passed - 10/28/2022 11:01 PM      Passed - ALT in normal range and within 360 days    ALT  Date Value Ref Range Status  11/20/2021 17 0 - 44 IU/L Final         Passed - AST in normal range and within 360 days    AST  Date Value Ref Range Status  11/20/2021 23 0 - 40 IU/L Final         Passed - Valid encounter within last 12 months    Recent Outpatient Visits           6  months ago Diabetes mellitus without complication [facility])   [place] Eulas Post, MD   9 months ago Diabetes mellitus without complication [facility])   [place] Eulas Post, MD   11 months ago Annual physical exam   [facility] Eulas Post, MD   1 year ago Annual physical exam   [facility] Eulas Post, MD   3 years ago Annual physical exam   [facility] Eulas Post, MD

## 2022-11-23 ENCOUNTER — Encounter: Payer: Managed Care, Other (non HMO) | Admitting: Family Medicine

## 2023-06-09 ENCOUNTER — Other Ambulatory Visit: Payer: Self-pay | Admitting: Family Medicine

## 2023-06-09 NOTE — Telephone Encounter (Signed)
PCP no longer at the Practice  Requested Prescriptions  Pending Prescriptions Disp Refills   tadalafil (CIALIS) 5 MG tablet [Pharmacy Med Name: Tadalafil 5 MG Oral Tablet] 90 tablet 3    Sig: TAKE 1 TABLET BY MOUTH DAILY     Urology: Erectile Dysfunction Agents Failed - 06/09/2023  8:31 AM      Failed - AST in normal range and within 360 days    AST  Date Value Ref Range Status  11/20/2021 23 0 - 40 IU/L Final         Failed - ALT in normal range and within 360 days    ALT  Date Value Ref Range Status  11/20/2021 17 0 - 44 IU/L Final         Failed - Valid encounter within last 12 months    Recent Outpatient Visits           1 year ago Diabetes mellitus without complication [facility])   [place] [facility] Bosie Clos, MD   1 year ago Diabetes mellitus without complication [facility])   [place] [facility] Bosie Clos, MD   1 year ago Annual physical exam    [facility] Bosie Clos, MD   2 years ago Annual physical exam   [facility] Bosie Clos, MD   4 years ago Annual physical exam   [facility] Bosie Clos, MD              Passed - Last BP in normal range    BP Readings from Last 1 Encounters:  04/08/22 113/85

## 2023-06-15 ENCOUNTER — Other Ambulatory Visit: Payer: Self-pay | Admitting: Family Medicine

## 2023-06-15 NOTE — Telephone Encounter (Signed)
Please advise 

## 2023-06-25 ENCOUNTER — Other Ambulatory Visit: Payer: Self-pay | Admitting: Family Medicine

## 2023-06-27 NOTE — Telephone Encounter (Signed)
Last OV-04/08/22 Please advise
